# Patient Record
Sex: Female | Born: 1960 | Race: White | Hispanic: No | Marital: Married
Health system: Midwestern US, Academic
[De-identification: ages and names within clinical notes are randomized; demographics above are authoritative.]

---

## 2017-10-21 ENCOUNTER — Encounter: Admit: 2017-10-21 | Discharge: 2017-10-21 | Payer: BC Managed Care – PPO

## 2017-10-21 DIAGNOSIS — C20 Malignant neoplasm of rectum: Principal | ICD-10-CM

## 2017-10-21 NOTE — Telephone Encounter
Records reviewed orders placed for rectal Endoscopic Ultrasound at [facility]

## 2017-11-02 ENCOUNTER — Encounter: Admit: 2017-11-02 | Discharge: 2017-11-02 | Payer: BC Managed Care – PPO

## 2017-11-02 NOTE — Telephone Encounter
Contacted pt to schedule rectal EUS no answer. Left msg for pt to contact [phone].

## 2017-11-09 MED ORDER — PEG-ELECTROLYTE SOLN 420 GRAM PO SOLR
0 refills | Status: AC
Start: 2017-11-09 — End: ?

## 2017-11-09 NOTE — Telephone Encounter
Outbound call returning pt call from 11/02/17.     Pt is scheduled for GI procedure rectal EUS with Dr. Arnetha Gula on 11/16/17 1600. Pt denies being diabetic or on blood thinning product. Pt is aware a split dose bowel will need to be completed prior to procedure. Prep sent to preferred pharmacy.     Prep instructions emailed to pt at [email or web address]

## 2017-11-16 ENCOUNTER — Ambulatory Visit: Admit: 2017-11-16 | Discharge: 2017-11-16 | Payer: BC Managed Care – PPO

## 2017-11-23 ENCOUNTER — Encounter: Admit: 2017-11-23 | Discharge: 2017-11-23 | Payer: BC Managed Care – PPO

## 2018-03-07 ENCOUNTER — Encounter: Admit: 2018-03-07 | Discharge: 2018-03-07 | Payer: BC Managed Care – PPO

## 2018-03-07 DIAGNOSIS — Z08 Encounter for follow-up examination after completed treatment for malignant neoplasm: ICD-10-CM

## 2018-03-07 DIAGNOSIS — C20 Malignant neoplasm of rectum: Principal | ICD-10-CM

## 2018-03-07 MED ORDER — SODIUM CHLORIDE 0.9 % IV SOLP
INTRAVENOUS | 0 refills | Status: CN
Start: 2018-03-07 — End: ?

## 2018-03-08 ENCOUNTER — Ambulatory Visit: Admit: 2018-03-08 | Discharge: 2018-03-08 | Payer: BC Managed Care – PPO

## 2018-03-08 ENCOUNTER — Encounter: Admit: 2018-03-08 | Discharge: 2018-03-08 | Payer: BC Managed Care – PPO

## 2018-03-17 ENCOUNTER — Encounter: Admit: 2018-03-17 | Discharge: 2018-03-17 | Payer: BC Managed Care – PPO

## 2018-03-17 MED ORDER — PEG-ELECTROLYTE SOLN 420 GRAM PO SOLR
0 refills | Status: AC
Start: 2018-03-17 — End: ?

## 2018-05-27 ENCOUNTER — Encounter: Admit: 2018-05-27 | Discharge: 2018-05-27 | Payer: BC Managed Care – PPO

## 2018-05-27 DIAGNOSIS — F419 Anxiety disorder, unspecified: Principal | ICD-10-CM

## 2018-05-31 ENCOUNTER — Encounter: Admit: 2018-05-31 | Discharge: 2018-05-31 | Payer: BC Managed Care – PPO

## 2018-05-31 ENCOUNTER — Ambulatory Visit: Admit: 2018-05-31 | Discharge: 2018-05-31 | Payer: BC Managed Care – PPO

## 2018-05-31 ENCOUNTER — Ambulatory Visit: Admit: 2018-05-31 | Discharge: 2018-06-01 | Payer: BC Managed Care – PPO

## 2018-05-31 DIAGNOSIS — F419 Anxiety disorder, unspecified: Principal | ICD-10-CM

## 2018-05-31 MED ORDER — PROPOFOL 10 MG/ML IV EMUL 50 ML (INFUSION)(AM)(OR)
INTRAVENOUS | 0 refills | Status: DC
Start: 2018-05-31 — End: 2018-05-31

## 2018-05-31 MED ORDER — PROPOFOL INJ 10 MG/ML IV VIAL
0 refills | Status: DC
Start: 2018-05-31 — End: 2018-05-31

## 2018-05-31 MED ORDER — LACTATED RINGERS IV SOLP
INTRAVENOUS | 0 refills | Status: AC
Start: 2018-05-31 — End: ?

## 2018-05-31 MED ORDER — ONDANSETRON HCL (PF) 4 MG/2 ML IJ SOLN
4 mg | Freq: Once | INTRAVENOUS | 0 refills | Status: AC | PRN
Start: 2018-05-31 — End: ?

## 2018-05-31 MED ORDER — LIDOCAINE (PF) 10 MG/ML (1 %) IJ SOLN
.1-2 mL | INTRAMUSCULAR | 0 refills | Status: AC | PRN
Start: 2018-05-31 — End: ?

## 2018-05-31 MED ORDER — LACTATED RINGERS IV SOLP
0 refills | Status: DC
Start: 2018-05-31 — End: 2018-05-31

## 2018-06-01 ENCOUNTER — Encounter: Admit: 2018-06-01 | Discharge: 2018-06-01 | Payer: BC Managed Care – PPO

## 2018-06-01 DIAGNOSIS — F419 Anxiety disorder, unspecified: Principal | ICD-10-CM

## 2022-03-26 ENCOUNTER — Encounter: Admit: 2022-03-26 | Discharge: 2022-03-26 | Payer: BC Managed Care – PPO

## 2022-04-08 ENCOUNTER — Encounter: Admit: 2022-04-08 | Discharge: 2022-04-08 | Payer: Private Health Insurance - Indemnity

## 2022-04-08 ENCOUNTER — Ambulatory Visit: Admit: 2022-04-08 | Discharge: 2022-04-08 | Payer: Private Health Insurance - Indemnity

## 2022-04-08 DIAGNOSIS — R053 Chronic cough: Secondary | ICD-10-CM

## 2022-04-08 DIAGNOSIS — R059 Cough, unspecified type: Secondary | ICD-10-CM

## 2022-04-08 DIAGNOSIS — J329 Chronic sinusitis, unspecified: Secondary | ICD-10-CM

## 2022-04-08 LAB — CBC AND DIFF
ABSOLUTE BASO COUNT: 0 K/UL (ref 0–0.20)
ABSOLUTE EOS COUNT: 0.8 K/UL — ABNORMAL HIGH (ref 0–0.45)
ABSOLUTE LYMPH COUNT: 2 K/UL (ref 1.0–4.8)
ABSOLUTE MONO COUNT: 0.6 K/UL (ref 0–0.80)
ABSOLUTE NEUTROPHIL: 2.3 K/UL (ref 1.8–7.0)
BASOPHILS %: 1 % (ref 0–2)
EOSINOPHILS %: 14 % — ABNORMAL HIGH (ref >60)
HEMOGLOBIN: 13 g/dL (ref 12.0–15.0)
LYMPHOCYTES %: 35 % (ref 24–44)
MCH: 30 pg (ref 26–34)
MCHC: 33 g/dL (ref 32.0–36.0)
MCV: 92 FL (ref 80–100)
MPV: 7.2 FL (ref 7–11)
PLATELET COUNT: 306 K/UL — ABNORMAL HIGH (ref 150–400)
RDW: 14 % (ref 11–15)
WBC COUNT: 6 K/UL (ref 4.5–11.0)

## 2022-04-08 LAB — COMPREHENSIVE METABOLIC PANEL
ANION GAP: 11 % (ref 3–12)
CO2: 25 MMOL/L — ABNORMAL LOW (ref 21–30)
CREATININE: 0.7 mg/dL (ref 0.4–1.00)
GLUCOSE,PANEL: 83 mg/dL (ref 70–100)
POTASSIUM: 3.8 MMOL/L (ref 3.5–5.1)

## 2022-04-08 LAB — IMMUNOGLOBULINS-IGA,IGG,IGM
IGA: 240 mg/dL (ref 70–390)
IGG: 656 mg/dL — ABNORMAL LOW (ref 762–1488)
IGM: 168 mg/dL (ref 38–328)

## 2022-04-08 MED ORDER — FLUTICASONE PROPIONATE 50 MCG/ACTUATION NA SPSN
2 | Freq: Two times a day (BID) | NASAL | 1 refills | 60.00000 days | Status: AC
Start: 2022-04-08 — End: ?

## 2022-04-08 NOTE — Progress Notes
Date of Service: 04/08/2022       SUBJECTIVE  Heather Gilbert is a 61 y.o. 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  Referred by: Wilford Grist, MD.  PCP is: Hedda Slade, MD.      Chief Complaint   Patient presents with   ? New Patient       History of Present Illness    Per review of outside records, the patient has a longstanding h/o sinusitis and bronchitis, with a recent absolute eosinophil count of 1260.  Mentioned is a suspicion for an allergic origin of these conditions.  The patient has had multiple antibiotic and steroid courses over the last 5 months.  Most recently, in April 2023 was prescribed a 3-week course of Augmentin, as well as a course of prednisone, for chronic sinusitis.  CT sinus is planned for 4-6 weeks after completion of antibiotics.  CT chest was recently ordered but we do not have those results at the time of today's visit.  Of note, the patient's home was recently treated for presence of mold.     The patient states that she had pneumonia in Nov 2022, for which she was hospitalized and treated with IV antibiotics.  She was feeling better by the time she left the hospital, but not back to her normal self.  She never lost the cough after being discharged and her cough continued to be productive of thick, green mucus, and sometimes formed and bloody pieces of mucus.  She states that the recent CT showed mucus plugging and healed rib fractures, presumably from the violent coughing at the time of pneumonia.     Rhinitis/Sinusitis History  - Age at onset: Since childhood  - Symptoms:   - Worst symptom:   - Seasonality: Spring and fall are particularly bad, but some symptoms in winters  - Triggers: No  - H/o nasal polyps: No  - Aspirin/NSAID sensitivity: No  - Prior evaluation: No  - Prior allergy shots: No  - Pets: Dog, sleeps in her bed  - Previously tried meds:   - Zyrtec daily for 1 week around Jan or Feb 2023 with no immediate improvement    Respiratory History  - Never diagnosed with asthma and has no h/o inhaler prescription aside from albuterol inhaler prescribed at discharge from hospitalization for pneumonia.   - Symptoms: Coughing, headaches, fever, and sore throat occasionally proceed symptoms dropping into her lungs and coughing worsening.   - Triggers: Follows same pattern as sinus symptoms, 2-3 times per year with worst times being fall through spring.   - Worse with aspirin/NSAIDs: No  - Ever needed oral steroids for treatment of respiratory symptoms: 2-3 times per year.  - Antibiotics for symptoms: 2-3 times per year .  - Albuterol inhaler uses per week: No  - Ever had an asthma exacerbation requiring intubation: No  - Ever been hospitalized for asthma: No  - Waking at night due to symptoms such as wheezing, SOB, coughing, etc: Nightly over the last month.    Infection History  - Birth History:  Born full term.  No NICU stay after delivery.  No history of failure to thrive.  - In an average year:  3 sinus infections, 0 ear infections as an adult, 0 Strep pharyngitis as an adult, and 0 bacterial conjunctivitis.  Has never had pneumonia.    - No history  of severe infections with warts, recurrent or severe oropharyngeal or skin fungal infections, or recurrent skin or deep organ abscesses requiring I&D to resolve or cellulitis.  No history of any other unusual, severe, or recurrent infections.   - No history of infections needing hospitalization (infectious meningitis, osteomyelitis, sepsis, septic arthritis, or episodes of pneumonia) or need for IV antibiotics to treat infections.    - No history of IVIG therapy.  - No known history of using chemotherapy drugs, immunosuppressive medications, or anti-seizure medications.   - No known history of chronic liver disease, kidney disease, chronic diarrhea, HIV, autoimmune disease, or cancer.  - Up to date on all childhood vaccines and tolerated them to the best of their knowledge.   - No family history of primary immune deficiency.                   Review of Systems  A 12-point review of systems has been reviewed and the remainder are all negative other than as noted above and as in the HPI.  Any chronic issues are being addressed by a variety of physicians other than as is noted in the HPI.    04/08/2022 4:46 PM        Histories  Medical History:   Diagnosis Date   ? Anxiety      Surgical History:   Procedure Laterality Date   ? HX BUNIONECTOMY Right 2015   ? SIGMOIDOSCOPY WITH ENDOSCOPIC ULTRASOUND EXAMINATION - FLEXIBLE N/A 05/31/2018    Performed by Bernita Buffy, MD at [facility] OR   ? SIGMOIDOSCOPY WITH TRANSENDOSCOPIC ULTRASOUND GUIDED INTRAMURAL/ TRANSMURAL FINE NEEDLE ASPIRATION/ BIOPSY - FLEXIBLE N/A 05/31/2018    Performed by Bernita Buffy, MD at [facility] OR   ? DILATION AND CURETTAGE       No family history on file.  Social History     Socioeconomic History   ? Marital status: Married   Tobacco Use   ? Smoking status: Former     Packs/day: 0.50     Years: 3.00     Pack years: 1.50     Types: Cigarettes   ? Smokeless tobacco: Never   Substance and Sexual Activity   ? Alcohol use: Yes     Alcohol/week: 6.0 standard drinks     Types: 6 Cans of beer per week     Comment: OCCASIONALLY   ? Drug use: Not Currently     No Known Allergies                  OBJECTIVE           Medications  ? amoxicillin/K clavulanate (AUGMENTIN) 500/125 mg tablet Take one tablet by mouth every 8 hours. Take with food.   ? citalopram hydrobromide (CELEXA PO) Take  by mouth.   ? fluticasone propionate (FLONASE ALLERGY RELIEF) 50 mcg/actuation nasal spray, suspension Apply two sprays to each nostril as directed twice daily. Shake bottle gently before using.       Vitals:    04/08/22 1524   BP: 133/88   Pulse: 97   SpO2: 96%   PainSc: Zero   Weight: Comment: Per pt       Physical Exam  General: Alert, cooperative, and in no acute distress.  Eye: PER, EOMI.  No scleral icterus.  Clear conjunctiva.  HENT: Normocephalic.  Normal external ear structures.  TMs intact bilaterally without erythema, edema, or fluid.  No sinus tenderness.  No rhinorrhea present.  Moist mucous membranes.  No oral lesions or erythema.   CV: Regular rate and rhythm.  No murmur, gallop, or rub.    Pulm: Good air movement all the way to lung bases, wheezes at lung bases (R>L) at end-inspiration.     Skin: Warm and dry.  Normal color and texture.  No rashes or lesions on exposed skin.    MSK: No joint abnormalities.  Grossly normal ROM.   No lower extremity edema.    Neuro: Motor and sensation grossly intact.  CNs grossly intact.          ASSESSMENT & PLAN  Problem   Recurrent Sinusitis   Chronic Cough    Recurrent sinusitis has been a problem for many years, as well as recurrent bronchitis.  Since Nov 2022 when she was hospitalized with pneumonia, chronic cough has been an issue.  Symptoms have not resolved despite multiple rounds of antibiotics and steroids since then.      Reported eosinophilia in the presence of chronic cough and sinusitis hints at the potential for an allergic process that could be at least contributing to her symptoms.  Vasculitis is also considered given sinusitis and persistent cough that has been productive of blood-streaked mucus.  ABPA is another possibility, again given the persistence of symptoms and the patient's report of formed pieces of dark colored mucus and CT chest showing mucus plugging.  While her h/o is not strongly suggestive of an underlying immunodeficiency, an initial evaluation for such is appropriate given her report of sinusitis/bronchitis requiring treatment with antibiotics and steroids multiple times a year, even before her more recent hospitalization for pneumonia.  Recurrent bronchitis is a common diagnosis in patient's with asthma and thus further evaluation for the possibility of asthma is warranted. Plan:  - CT max/face  - Complete PFTs with methacholine challenge  - Labs:   - IgG, IgM, IgA   - CBC w/ diff   - CMP   - MPO/PR3 w/ reflex to ANCA   - Aspergillosis panel  - Referral to Pulmonology  - Start Flonase nasal spray, 2 sprays each nostril in the morning and evening.    - If no improvement with Flonase after 3-4 weeks, continue Flonase and add on azelastine nasal spray, 2 sprays each nostril in the morning and evening.   - RTC for aeroallergen SPT  - If cough is ever productive of frank blood (ie, more than just minor blood-streaking of mucus), go to the nearest ER or call 911 immediately.                     RTC 4 weeks.    Thank you for allowing [place] to participate in this patient's care.  Patient was seen and discussed with Dr. Mikey College.  Please feel free to contact us with any questions or concerns.    Cindra Presume, MD  Allergy/Immunology Fellow  Division of Allergy, Immunology, and Rheumatology  Department of Internal Medicine and Department of Pediatrics  [facility]           [place] Primary Allergist: Dr. Kirtland Bouchard at [facility].

## 2022-04-21 ENCOUNTER — Encounter: Admit: 2022-04-21 | Discharge: 2022-04-21 | Payer: Private Health Insurance - Indemnity

## 2022-04-21 ENCOUNTER — Ambulatory Visit: Admit: 2022-04-21 | Discharge: 2022-04-21 | Payer: Private Health Insurance - Indemnity

## 2022-04-21 DIAGNOSIS — R059 Cough, unspecified type: Secondary | ICD-10-CM

## 2022-04-28 ENCOUNTER — Encounter: Admit: 2022-04-28 | Discharge: 2022-04-28 | Payer: Private Health Insurance - Indemnity

## 2022-04-28 NOTE — Telephone Encounter
Received vm from pt. Pt scheduled for PFTs on 5/23. Has follow up with Dr. Floydene Flock 5/24. Pt would like to know if PFT can be completed while on steroids. Pt has steroid shot on 5/15 with a 5 day course of oral steroids. Routing to provider

## 2022-04-30 ENCOUNTER — Encounter: Admit: 2022-04-30 | Discharge: 2022-04-30 | Payer: Private Health Insurance - Indemnity

## 2022-04-30 NOTE — Telephone Encounter
Received VM from pt. Pt would like to know if PFT can be completed while on steroids. Pt had steroid shot on 5/15 with a 5 day course of oral steroids. Routed to provider on 5/16.Will route to on call provider

## 2022-05-05 ENCOUNTER — Encounter: Admit: 2022-05-05 | Discharge: 2022-05-05 | Payer: Private Health Insurance - Indemnity

## 2022-05-05 ENCOUNTER — Ambulatory Visit: Admit: 2022-05-05 | Discharge: 2022-05-05 | Payer: Private Health Insurance - Indemnity

## 2022-05-05 DIAGNOSIS — R059 Cough, unspecified type: Secondary | ICD-10-CM

## 2022-05-06 ENCOUNTER — Encounter: Admit: 2022-05-06 | Discharge: 2022-05-06 | Payer: Private Health Insurance - Indemnity

## 2022-05-06 ENCOUNTER — Ambulatory Visit: Admit: 2022-05-06 | Discharge: 2022-05-07 | Payer: Private Health Insurance - Indemnity

## 2022-05-06 DIAGNOSIS — J329 Chronic sinusitis, unspecified: Secondary | ICD-10-CM

## 2022-05-06 DIAGNOSIS — F419 Anxiety disorder, unspecified: Secondary | ICD-10-CM

## 2022-05-06 DIAGNOSIS — R053 Chronic cough: Secondary | ICD-10-CM

## 2022-05-06 MED ORDER — FLUTICASONE PROPIONATE 220 MCG/ACTUATION IN HFAA
2 | Freq: Every day | RESPIRATORY_TRACT | 3 refills | Status: AC
Start: 2022-05-06 — End: ?

## 2022-05-06 MED ORDER — IPRATROPIUM BROMIDE 42 MCG (0.06 %) NA SPRY
2 | Freq: Four times a day (QID) | NASAL | 3 refills | 30.00000 days | Status: AC
Start: 2022-05-06 — End: ?

## 2022-05-06 NOTE — Progress Notes
Date of Service: 05/06/2022  Date of last visit with Allergy/Immunology: 04/08/2022 with Dr. Kirtland Bouchard       SUBJECTIVE  Heather Gilbert is a 61 y.o. female with h/o eosinophilia; chronic sinusitis; chronic bronchitis who presents today to the Allergy/Immunology clinic for a follow-up visit.      Chief Complaint   Patient presents with   ? Follow Up     Cough, unspecified type       History of Present Illness     Following her last visit, we had her obtain multiple labs in evaluation of her signs and symptoms.  Notable was a slightly elevated eosinophil count of 840 (down from 1260), a significantly elevated IgE count of over 2500, and a slightly low IgG count of 656.  In addition, she underwent complete PFTs with methacholine challenge, which showed decreased FEV1/FVC ratio of 66%, FEF 25-75% reduced value of 43%, and 21% decrease in FEV1 upon exposure to 0.5 mg/ml methacholine.      Today the patient reports that her symptoms have improved, overall, but she still experiences cough, as well as nasal/sinus congestion and drainage.  She denies shortness of breath and wheezing at this time.  She saw her PCP about 2 weeks ago and was prescribed doxycycline and another course of prednisone; she completed the prednisone on 05/02/22 and has a few days left of the doxycycline.  She feels these medications have been beneficial.      At her previous visit, she reported episodes of wheezing, nasal/sinus congestion, runny nose, and postnasal drainage since childhood, though she has never been diagnosed with asthma or allergic rhinitis.  Spring and fall are particularly bad times of year for these symptoms.      She denies h/o rashes, neuropathy, and arthralgia/myalgia. Her sense of smell is intact.       Review of Systems  A 12-point review of systems has been reviewed and the remainder are all negative other than as noted above and as in the HPI.  Any chronic issues are being addressed by a variety of physicians other than as is noted in the HPI.         OBJECTIVE      05/06/2022 3:51 PM      Medications  ? citalopram hydrobromide (CELEXA PO) Take  by mouth.   ? doxycycline hyclate (VIBRACIN) 100 mg tablet    ? fluticasone propionate (FLONASE ALLERGY RELIEF) 50 mcg/actuation nasal spray, suspension Apply two sprays to each nostril as directed twice daily. Shake bottle gently before using.   ? fluticasone propionate (FLOVENT HFA) 220 mcg/actuation inhaler Inhale two puffs by mouth into the lungs daily.   ? ipratropium bromide (ATROVENT) 42 mcg (0.06 %) nasal spray Apply two sprays to each nostril as directed four times daily.       Vitals:    05/06/22 1337   BP: 125/85   BP Source: Arm, Left Upper   Pulse: 85   Temp: 36.2 ?C (97.2 ?F)   Resp: 20   TempSrc: Oral   PainSc: Zero   Weight: 65.8 kg (145 lb)  Comment: Per pt   Height: 158.8 cm (5' 2.5)       Physical Exam  General: Alert, cooperative, and in no acute distress.  Eye: PER, EOMI.  No scleral icterus.  Clear conjunctiva.  HENT: Normocephalic.  Normal external ear structures.  No sinus tenderness.  No rhinorrhea present.  Moist mucous membranes. No nasal polyps seen.   CV: Regular rate and  rhythm.  No murmur, gallop, or rub.    Pulm: Clear to auscultation bilaterally.  No wheezes or crackles.     Skin: Warm and dry.  Normal color and texture.  No rashes or lesions on exposed skin.    MSK: No joint abnormalities.  Grossly normal ROM.  No lower extremity edema.    Neuro: Motor and sensation grossly intact.  CNs grossly intact.     Testing Information  Consent Obtained: Yes  Time Antigen Placed: 1424  Needle Type: Multi-Test II Device  Test Location: Back  Antigen Manufacturer: [employer]  Testing Nurse: L.Freddy Jaksch  Reviewing Physician: Reola Calkins MD  Time Test Read: [phone]    Controls  1. Dil w/50% Glycerin (HS): 0/0  2. Histamine dihydrochloride: 4/4    Trees  Ash, White 1:20 (HS): 0/0  Birch Mix 1:20 (HS): 0/0  Box Elder 1:20 (HS): 0/0  Cedar, Mountain 1:20 (HS): 0/0  Cottonwood 1:20 (HS): 5/5  Elm, Chinese 1:20 (HS): 0/0  Maple Mix 1:20 (HS): 0/0  Mulberry Mix 1:20 (HS): 0/0  Oak Mix 1:20 (HS): 0/0  Pine Mix 1:20 (HS): 0/0  Sweetgum 1:20 (HS): 0/0  Sycamore, American 1:20 (HS): 0/0  Walnut, Black 1:20 (HS): 0/0    Grasses  [place] 10,000 BAU/ml (HS): 0/0  Johnson 1:20 (HS): 0/0  Sweet Vernal 100,000 BAU/ml (HS): 0/0  Timothy 100,000 BAU/ml (HS): 0/0     Weeds  Careless/Pigweed Mix 1:20 (HS): 0/0  Cocklebur 1:20 (HS): 0/0  Dock/Sorrel Mix 1:20 (HS): 0/0  Kochia 1:20 (HS): 0/0  Marsh Elder 1:20 (HS): 0/0  Nettle 1:20 (HS): 0/0  Plantain, English 1:20 (HS): 0/0  Ragweed, Short 1:20 (HS): 0/0  Ragweed, Western 1:20 (HS): 0/0  [place] Thistle 1:20 (HS): 0/0  Sagebrush/Mugwort 1:20 (HS): 0/0    Mites and Cockroaches  Cockroach, American 1:10 (HS): 0/0  Cockroach, German 1:10 (HS): 0/0  Derm. Farinae 30,000 AU/ml (HS): 0/0  Derm. Pteronyssinus 30,000 AU/ml (HS): 0/0    Animals  Cat, AP Pelt 10,000 BAU/ml (HS): 0/0  Dog, AP Hair and Dander 1:10 (HS): 0/0  Feather Mix 1:10 (HS): 0/0    Molds and Fungi  Alternaria Tenuis 1:10 (HS): 0/0  Aspergillus Fumigatus 1:10 (HS): 0/0  Curvularia Spiciferas 1:10 (HS): 0/0  Epicoccum Nigrum 1:10 (HS): 0/0  Fusarium Vasinfectum 1:10 (HS): 0/0  Helminthosporium Inters. 1:10 (HS): 0/0  Horm. Cladosporioides 1:10 (HS): 0/0  Mucor Racemosus 1:10 (HS): 0/0  Penicillium [place] 1:10 (HS): 0/0  Phoma Herbarum 1:10 (HS): 0/0  Rhizopus Nigricans 1:10 (HS): 0/0  Stemphylium Botryosum 1:10 (HS): 0/0  Histamine Phosphate 10mg /ml (HS): 8/8           ASSESSMENT & PLAN    Problem   Chronic Cough    Recurrent sinusitis has been a problem for many years, as well as recurrent bronchitis.  Since Nov 2022 when she was hospitalized with pneumonia, chronic cough has been an issue.  Symptoms have not resolved despite multiple rounds of antibiotics and steroids since then.    Reported eosinophilia in the presence of chronic cough and sinusitis hints at the potential for an allergic process that could be at least contributing to her symptoms.  Vasculitis is also considered given sinusitis and persistent cough that has been productive of blood-streaked mucus in the past.  ABPA was considered, again given the persistence of symptoms and the patient's report of formed pieces of dark colored mucus and CT chest showing mucus plugging, but testing was negative.  04/08/22 - Labwork notable for a slightly elevated eosinophil count of 840 (down from 1260), a significantly elevated IgE count of over 2500, and a slightly low IgG count of 656.     05/05/22 - PFT with decreased FEV1/FVC of 66%, FEF 25-75% reduced value of 43%, and 21% decrease in FEV1 upon exposure to 0.5 mg/ml methacholine.      05/06/22 - SPT positive only for sensitization to [facility], which would not explain persistent symptoms since last fall.     Plan:  - Patient will request CT chest from OSH be sent to our clinic.  - Continue Flonase 2 sprays each nostril in the morning and evening.   - Start Atrovent 2 sprays 2-4 times daily as needed for PND.  - Start Flovent 220 mcg 2 puffs daily.  - Repeat CBC w/ diff, IgE, and IgG in 1 month (last dose of steroids was on 05/02/22).  - If concerning findings on CT chest from OSH and/or on repeat blood work, consider referral to ENT to evaluate for presence of nasal polyps and Rheumatology to evaluate for EGPA.    - Continue to monitor for the following signs/symptoms that would be concerning for EGPA:   - Arthralgia   - Myalgia   - Neuropathy (particularly in a stocking/glove distribution)   - Rash (especially on lower extremities)   - Cardiac symptoms   - Worsening respiratory function  - If cough is ever productive of frank blood (ie, more than just minor blood-streaking of mucus), go to the nearest ER or call 911 immediately.                   RTC in 6 weeks.    Thank you for allowing [place] to participate in this patient's care.  Patient was seen and discussed with Dr. Barbette Or. Please feel free to contact us with any questions or concerns.    Cindra Presume, MD  Allergy/Immunology Fellow  Division of Allergy, Immunology, and Rheumatology  Department of Internal Medicine and Department of Pediatrics  [facility]         [place] Primary Allergist: Dr. Kirtland Bouchard at [facility]

## 2022-05-07 ENCOUNTER — Encounter: Admit: 2022-05-07 | Discharge: 2022-05-07 | Payer: Private Health Insurance - Indemnity

## 2022-05-07 DIAGNOSIS — F419 Anxiety disorder, unspecified: Secondary | ICD-10-CM

## 2022-05-25 ENCOUNTER — Encounter: Admit: 2022-05-25 | Discharge: 2022-05-25 | Payer: Private Health Insurance - Indemnity

## 2022-05-25 NOTE — Telephone Encounter
Received fax from [place] stating that Fluticasone HFA is a covered medication on patient's plan and PA is not required.

## 2022-06-26 ENCOUNTER — Encounter: Admit: 2022-06-26 | Discharge: 2022-06-26 | Payer: Private Health Insurance - Indemnity

## 2022-06-29 ENCOUNTER — Encounter: Admit: 2022-06-29 | Discharge: 2022-06-29 | Payer: Private Health Insurance - Indemnity

## 2022-06-29 ENCOUNTER — Ambulatory Visit: Admit: 2022-06-29 | Discharge: 2022-06-29 | Payer: Private Health Insurance - Indemnity

## 2022-06-29 DIAGNOSIS — J45909 Unspecified asthma, uncomplicated: Secondary | ICD-10-CM

## 2022-06-29 DIAGNOSIS — R0602 Shortness of breath: Secondary | ICD-10-CM

## 2022-06-29 DIAGNOSIS — J189 Pneumonia, unspecified organism: Secondary | ICD-10-CM

## 2022-06-29 DIAGNOSIS — F419 Anxiety disorder, unspecified: Secondary | ICD-10-CM

## 2022-06-29 DIAGNOSIS — J329 Chronic sinusitis, unspecified: Secondary | ICD-10-CM

## 2022-06-29 DIAGNOSIS — J42 Unspecified chronic bronchitis: Secondary | ICD-10-CM

## 2022-06-29 DIAGNOSIS — R053 Chronic cough: Secondary | ICD-10-CM

## 2022-06-29 DIAGNOSIS — J4540 Moderate persistent asthma, uncomplicated: Secondary | ICD-10-CM

## 2022-06-29 DIAGNOSIS — C801 Malignant (primary) neoplasm, unspecified: Secondary | ICD-10-CM

## 2022-06-29 LAB — CBC AND DIFF
ABSOLUTE BASO COUNT: 0 K/UL (ref 0–0.20)
ABSOLUTE EOS COUNT: 0.5 K/UL — ABNORMAL HIGH (ref 0–0.45)
ABSOLUTE LYMPH COUNT: 1.6 K/UL (ref 1.0–4.8)
ABSOLUTE MONO COUNT: 0.5 K/UL (ref 0–0.80)
ABSOLUTE NEUTROPHIL: 2.5 K/UL (ref 1.8–7.0)
HEMOGLOBIN: 13 g/dL (ref 12.0–15.0)
RBC COUNT: 4.2 M/UL — ABNORMAL HIGH (ref 4.0–5.0)
WBC COUNT: 5.3 K/UL (ref 4.5–11.0)

## 2022-06-29 LAB — IMMUNOGLOBULIN E (IGE): IGE: >25 [IU]/mL — ABNORMAL HIGH (ref <101)

## 2022-06-29 LAB — IMMUNOGLOBULIN G (IGG): IGG: 677 mg/dL — ABNORMAL LOW (ref 762–1488)

## 2022-06-29 NOTE — Progress Notes
Date of Service: 06/29/2022    Subjective:             Heather Gilbert is a very pleasant 61 y.o. female presenting for further management of her pulmonary issues.    Heather Gilbert was accompanied today by her mother who also helps provide history.  Patient reports being at baseline health until September when she traveled to [place] for 1 week. Since September patient has gone through 3-4 cycles of treatment with steroids and antibiotics where she sees improvement until steroids and antibiotics are terminated and then symptoms recur.  Most bothersome symptoms include postnasal drip, cough with green sputum production, chest tightness, intermittent wheezing, and DOE with rest.  In November patient reports being hospitalized with pneumonia, suspected bacterial at outside hospital.  During that hospital stay she was treated with antibiotics, steroids and discharged with nebulizer and short course albuterol.  Patient noted some improvement but never returned to baseline function prior to September.    More recently in May patient was seen in allergy clinic and started on Flovent for suspected asthma.  Patient underwent PFTs which noted mild obstruction and 20% decrease in FVC with methacholine consistent with asthma.  She was started on Flovent 2 puffs/day.  Noted significant improvement for the next 4 to 5 weeks, but the previous 2 weeks is now worsening symptoms.  Specifically she has noted worsening postnasal drip, sinusitis, sinus pressure.  Is currently on Flonase and intranasal ipratropium which helps somewhat but symptoms still managed to recur.     Patient did have pneumonia at the age of 6 that required hospitalization and IV antibiotics but otherwise has been free of asthma.  Has lungs that report lung diseases including asthma and COPD.        Cough questions  - Duration: months  - Timing: worse in the am  - Sputum production: green  - Aggravating factors:  infection or hot air, but often random.   - Alleviating factors: Rest and fluids help minimally  - Associated with eating:  No  - Associated with drinking:  No  - Any allergies: No  - Any post-nasal drip: Yes  - Any shortness of breath: Yes  - Any wheezing: Yes  - Previous tried therapies: antitussives, hydration or inhalers: Previous nebulizer    Asthma questions  - Diagnosed: month  - Shortness of breath: Yes  - Shortness of breath severity: at rest  - Aggravating factors:  exertion, exercise, climbing stairs  - Alleviating factors: only temporary improvement  - Any cough: Yes  - Sputum production: Yes green  - Any wheezing: Yes  - Nocturnal symptoms: Yes Wakes up two times per week   - Previous tried therapies: antibiotics  - SABA use: Previous albuterol but not recently. Helped when did use  - Corticosteroids in the last 12 months: Yes  - ER/urgent care visits or hospitalizations in the last 12 months: Yes 1         Review of Systems   Constitutional: Negative for chills, diaphoresis, fever and unexpected weight change.   Respiratory: Positive for cough and shortness of breath.    Cardiovascular: Negative for chest pain and palpitations.   Gastrointestinal: Negative for abdominal pain, constipation, diarrhea, nausea and vomiting.        Negative for Dysphagia         Objective:         ? citalopram hydrobromide (CELEXA PO) Take  by mouth.   ? doxycycline hyclate (VIBRACIN) 100 mg  tablet    ? fluticasone propionate (FLONASE ALLERGY RELIEF) 50 mcg/actuation nasal spray, suspension Apply two sprays to each nostril as directed twice daily. Shake bottle gently before using.   ? fluticasone propionate (FLOVENT HFA) 220 mcg/actuation inhaler Inhale two puffs by mouth into the lungs daily.   ? ipratropium bromide (ATROVENT) 42 mcg (0.06 %) nasal spray Apply two sprays to each nostril as directed four times daily.     There were no vitals filed for this visit.  There is no height or weight on file to calculate BMI.     Physical Exam  Constitutional:       General: She is not in acute distress.     Appearance: She is not ill-appearing or toxic-appearing.   HENT:      Head: Normocephalic and atraumatic.      Nose: Nose normal.      Mouth/Throat:      Mouth: Mucous membranes are moist.      Pharynx: Oropharynx is clear.   Eyes:      Conjunctiva/sclera: Conjunctivae normal.   Cardiovascular:      Rate and Rhythm: Normal rate and regular rhythm.      Heart sounds: No murmur heard.     No friction rub. No gallop.   Pulmonary:      Effort: Pulmonary effort is normal. No respiratory distress.      Breath sounds: Normal breath sounds. No wheezing or rales.   Abdominal:      General: Abdomen is flat.      Palpations: Abdomen is soft.      Tenderness: There is no abdominal tenderness.   Neurological:      General: No focal deficit present.      Mental Status: She is alert and oriented to person, place, and time. Mental status is at baseline.              Latest Reference Range & Units 04/08/22 16:54   Hemoglobin 12.0 - 15.0 GM/DL 16.1   Hematocrit 36 - 45 % 41.5   Platelet Count 150 - 400 K/UL 306   White Blood Cells 4.5 - 11.0 K/UL 6.0   Neutrophils 41 - 77 % 39 (L)   Absolute Neutrophil Count 1.8 - 7.0 K/UL 2.33   Lymphocytes 24 - 44 % 35   Absolute Lymph Count 1.0 - 4.8 K/UL 2.07   Monocytes 4 - 12 % 11   Absolute Monocyte Count 0 - 0.80 K/UL 0.67   Eosinophils 0 - 5 % 14 (H)   Absolute Eosinophil Count 0 - 0.45 K/UL 0.84 (H)   Absolute Basophil Count 0 - 0.20 K/UL 0.08   Basophils 0 - 2 % 1   RBC 4.0 - 5.0 M/UL 4.49   MCV 80 - 100 FL 92.4   MCH 26 - 34 PG 30.7   MCHC 32.0 - 36.0 G/DL 09.6   MPV 7 - 11 FL 7.2   RDW 11 - 15 % 14.3   (L): Data is abnormally low  (H): Data is abnormally high       04/08/22 16:54   Total IgE 2,521 (H)   Aspergillus Fumigatus IgE <0.10   Reference range: <0.35   Unit: [place]/L      Aspergillus Fumigatus Class IgE 0   Aspergillus Fumigatus IgG 2.5   (H): Data is abnormally high    PFT personally reviewed: 05-05-2022, mild OVD, normal diffusing capacity    Methylene choline challenge test: 05-05-2022, greater than 20%  FEV1 decline with 0.5 mg/mL, consistent with positive response to methacholine    Radiology imaging personally reviewed:  CT chest, 03-30-2022:  1.  Abberant right subclavian artery  2.  Left lower lobe mucous plugging  3.  Tree-in-bud opacities  4.  Subsegmental atelectasis    Assessment & Plan   Heather Gilbert is a very pleasant 61 y.o. female presenting for further management of her pulmonary issues.    # Shortness of breath  # Chronic cough  # Asthma, not well controlled  # Abberant right subclavian artery  # Post nasal drip    Her cough and shortness of breath most likely related to an asthmatic process.  She also has a notable peripheral eosinophilia and elevated IgE.  Primary symptoms include postnasal drip and recurrent sinusitis that may be driving as asthma symptoms.  Today lungs with excellent airflow.  As patient has had significant benefit from Flovent we will currently double the dose.  Patient is currently scheduled to see allergy back in clinic 7/19. If her symptoms persistent may consider adding LABA/LAMA combination then biologic therapy.    Plan  > Increase Flovent 2 mg twice daily  > Pending recommendation from allergy, can consider evaluation of LAMA/LABA if patient continues to have recurrent symptoms.  > Appreciate evaluation for possible autoimmune/rheumatologic process.  > Ordered strongyloides antibody  > Return to clinic in 2-3 months.      Complexity: moderate  review/order lab tests, review/order radiology, review/order other diagnostic test, discussion of results with performing physician, decision to obtain old records and review/interpretation of images/stains/EKG/histopathology  new problem to examiner with additional work-up      Return visit:  in 3 month(s).    Gasper Lloyd, MD  Assistant Profressor of Medicine  Pulmonary and Critical Care Physician  06/29/2022

## 2022-06-29 NOTE — Telephone Encounter
Received lab results from Dr. Floydene Flock.. Printed and put in Megan's folder to be entered into 02.

## 2022-06-29 NOTE — Patient Instructions
Clinic Visit Summary:     Today we discussed your shortness of breath and wheezing in pulmonary clinic.  1) Will increase Flovent to twice a day  2) Added lab to blood work to investigate parasite    It was a pleasure to see you today, please don't hesitate to call with any future questions or concerns.      For questions or concerns, please contact my Clinical Nurse Coordinator at [phone] or send a My Chart message.     Please contact my nurse with any signs and symptoms of worsening productive cough with thick secretions, blood in sputum, chest pain or tightness, shortness of breath, fever, chills, night sweats, or any other pulmonary concerns.     -  For URGENT issues after business hours, weekends, or holidays: Call [phone] and request for the Pulmonary Fellow to be paged.    -  For medication refills, please ask your pharmacy to send an electronic request.  Please allow at least 3 business days for medication refills.      -  For equipment issues, please contact your Durable Medical Equipment (DME) company directly.    -  To cancel, change, or schedule a clinic appointment: Call Pulmonary Scheduling at [phone].

## 2022-07-01 ENCOUNTER — Encounter: Admit: 2022-07-01 | Discharge: 2022-07-01 | Payer: Private Health Insurance - Indemnity

## 2022-07-01 ENCOUNTER — Ambulatory Visit: Admit: 2022-07-01 | Discharge: 2022-07-02 | Payer: Private Health Insurance - Indemnity

## 2022-07-01 DIAGNOSIS — R053 Chronic cough: Secondary | ICD-10-CM

## 2022-07-01 DIAGNOSIS — J324 Chronic pansinusitis: Secondary | ICD-10-CM

## 2022-07-01 DIAGNOSIS — J329 Chronic sinusitis, unspecified: Secondary | ICD-10-CM

## 2022-07-01 DIAGNOSIS — D721 Eosinophilia, unspecified type: Secondary | ICD-10-CM

## 2022-07-01 DIAGNOSIS — D801 Nonfamilial hypogammaglobulinemia: Secondary | ICD-10-CM

## 2022-07-01 MED ORDER — PREDNISONE 10 MG PO TAB
10 mg | ORAL_TABLET | Freq: Two times a day (BID) | ORAL | 0 refills | Status: AC
Start: 2022-07-01 — End: ?

## 2022-07-01 MED ORDER — AMOXICILLIN-POT CLAVULANATE 875-125 MG PO TAB
1 | ORAL_TABLET | Freq: Two times a day (BID) | ORAL | 0 refills | 7.00000 days | Status: AC
Start: 2022-07-01 — End: ?

## 2022-08-27 ENCOUNTER — Encounter: Admit: 2022-08-27 | Discharge: 2022-08-27 | Payer: Private Health Insurance - Indemnity

## 2022-08-27 MED ORDER — FLUTICASONE PROPIONATE 50 MCG/ACTUATION NA SPSN
1 refills
Start: 2022-08-27 — End: ?

## 2022-08-28 ENCOUNTER — Encounter: Admit: 2022-08-28 | Discharge: 2022-08-28 | Payer: Private Health Insurance - Indemnity

## 2022-08-28 MED ORDER — IPRATROPIUM BROMIDE 42 MCG (0.06 %) NA SPRY
2 | Freq: Four times a day (QID) | NASAL | 3 refills
Start: 2022-08-28 — End: ?

## 2022-10-05 ENCOUNTER — Encounter: Admit: 2022-10-05 | Discharge: 2022-10-05 | Payer: Private Health Insurance - Indemnity

## 2022-10-14 ENCOUNTER — Encounter: Admit: 2022-10-14 | Discharge: 2022-10-14 | Payer: Private Health Insurance - Indemnity

## 2022-10-14 ENCOUNTER — Ambulatory Visit: Admit: 2022-10-14 | Discharge: 2022-10-14 | Payer: Private Health Insurance - Indemnity

## 2022-10-14 DIAGNOSIS — J42 Unspecified chronic bronchitis: Secondary | ICD-10-CM

## 2022-10-14 DIAGNOSIS — C801 Malignant (primary) neoplasm, unspecified: Secondary | ICD-10-CM

## 2022-10-14 DIAGNOSIS — J329 Chronic sinusitis, unspecified: Secondary | ICD-10-CM

## 2022-10-14 DIAGNOSIS — J45909 Unspecified asthma, uncomplicated: Secondary | ICD-10-CM

## 2022-10-14 DIAGNOSIS — D801 Nonfamilial hypogammaglobulinemia: Secondary | ICD-10-CM

## 2022-10-14 DIAGNOSIS — F419 Anxiety disorder, unspecified: Secondary | ICD-10-CM

## 2022-10-14 DIAGNOSIS — J189 Pneumonia, unspecified organism: Secondary | ICD-10-CM

## 2022-10-14 DIAGNOSIS — J4540 Moderate persistent asthma, uncomplicated: Secondary | ICD-10-CM

## 2022-10-14 MED ORDER — ALBUTEROL SULFATE 90 MCG/ACTUATION IN HFAA
2 | RESPIRATORY_TRACT | 0 refills | Status: AC | PRN
Start: 2022-10-14 — End: ?

## 2022-10-14 NOTE — Patient Instructions
You may call us with any questions or concerns.   Your Allergist's nurse can be reached at [phone] during usual business hours.   If you need to reach the Allergist/Pulmonology after usual business hours, you may call the hospital operator at [phone] and ask to speak with the Pulmonology fellow on-call.     Briselda Naval Rossitto's  Asthma Action Plan  10/14/2022  In General:   1) Eliminate exposure to your allergens and tobacco smoke   2) Rinse out your mouth after using any inhaled medication   3) Use your spacer device with all 'HFA' medications   4) Get your flu shot every year    5) Call and ask questions, especially if you change zones, below    Helpful phone numbers:   During usual office hours: Our nurse can be reached at [phone] during open business hours.   After usual business hours: Call the hospital operator at [phone] and ask to speak with the Allergy fellow on-call.           ** Chest symptoms include one or more of the following: cough, wheeze, chest tightness and shortness of breath**    GREEN ZONE:  When:   1) Generally feeling fine and   2) Needing albuterol no more than 2 days per week.  3) Chest symptoms lasting no more than 2 days a week.  If you have chest symptoms, take:   Albuterol 2 puffs every 4 hours, as needed   Albuterol 2 puffs 15-30 minutes prior to scheduled exercise, if needed  Regardless of chest symptoms, take these 'controller' medications:   Flovent 2 puffs twice a day    YELLOW ZONE:  When:   1) Chest symptoms lasting more than 2 days a week    2) Coughing or wheezing at night and waking up.  3) Cannot do usual activities because of asthma, or missing school or work because of asthma.   4) Start at the first sign of a cold virus, bronchitis, or flu symptoms or   5) Two days before entering an unfriendly environment or   6) If needing albuterol more than twice per week because of chest symptoms  How long:   At least 2 weeks and through 1 week of feeling well.  If you have chest symptoms, take:   Albuterol 2 puffs every 4 hours, as needed   Albuterol 2 puffs 15-30 minutes prior to scheduled exercise, if needed  Regardless of chest symptoms, take these 'controller' medications:   Flovent 2 puffs twice a day    RED ZONE:   Chest symptoms are not controlled by the Yellow Zone medications.  What to do:   Albuterol HFA 4-6 puffs inhaled every 30 minutes, as needed   Go directly to the nearest Emergency Room for treatment or call 911 for a life-threatening attack.

## 2022-10-19 ENCOUNTER — Encounter: Admit: 2022-10-19 | Discharge: 2022-10-19 | Payer: Private Health Insurance - Indemnity

## 2022-10-19 ENCOUNTER — Ambulatory Visit: Admit: 2022-10-19 | Discharge: 2022-10-20 | Payer: Private Health Insurance - Indemnity

## 2022-10-19 DIAGNOSIS — J45909 Unspecified asthma, uncomplicated: Secondary | ICD-10-CM

## 2022-10-19 DIAGNOSIS — C801 Malignant (primary) neoplasm, unspecified: Secondary | ICD-10-CM

## 2022-10-19 DIAGNOSIS — J189 Pneumonia, unspecified organism: Secondary | ICD-10-CM

## 2022-10-19 DIAGNOSIS — R053 Chronic cough: Secondary | ICD-10-CM

## 2022-10-19 DIAGNOSIS — J329 Chronic sinusitis, unspecified: Secondary | ICD-10-CM

## 2022-10-19 DIAGNOSIS — F419 Anxiety disorder, unspecified: Secondary | ICD-10-CM

## 2022-10-19 DIAGNOSIS — J42 Unspecified chronic bronchitis: Secondary | ICD-10-CM

## 2022-10-19 MED ORDER — FLUTICASONE PROPIONATE 220 MCG/ACTUATION IN HFAA
2 | Freq: Every day | RESPIRATORY_TRACT | 3 refills | Status: AC
Start: 2022-10-19 — End: ?

## 2022-10-19 NOTE — Progress Notes
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    History of Present Illness  We first met on 06-29-2022, where she described a several month history of cough, shortness of breath, sputum production, chest tightening, wheezing, and postnasal drip.  Her symptoms started after a 4-day trip to [place] in September 2022.  She has received multiple cycles of steroids and antibiotics with temporary improvement and her symptoms.    As part of her work-up she has undergone a CBC with differential,  CMP, Aspergillus IgE, immunoglobulins, MPO/PR-3, PFTs, methacholine challenge, and chest imaging.  These studies showed peripheral eosinophilia, hyper IgE, low IgG, normal creatinine, normal LFTs, normal Aspergillus IgE, and normal MPO/PR-3.  Skin prick test showed sensitivity to [place] only.  Her PFTs showed a mild obstruction, and methacholine challenge was positive for hyperreactive airway.  Her CT chest demonstrated small nodules, bronchiectasis, tree-in-bud opacities, and areas of endobronchial mucous plugging.  CT maxillofacial sinus demonstrated pansinusitis with secretions.    Today, she describes worsening of her chest congestion and sputum production.  She ran out of her Flovent approximately 9 days ago.  She has been using albuterol scheduled, twice a day.  She coughs up phlegm that is greenish in color.  She has minimal sinus congestion.  She denies any hemoptysis, rashes, or hematuria.         Review of Systems   Constitutional: Negative for chills, diaphoresis, fever and unexpected weight change.   Respiratory: Positive for cough. Negative for shortness of breath.    Cardiovascular: Negative for chest pain and palpitations.   Gastrointestinal: Negative for abdominal pain, constipation, diarrhea, nausea and vomiting.         Objective:         ??? albuterol sulfate (PROAIR HFA) 90 mcg/actuation HFA aerosol inhaler Inhale two puffs by mouth into the lungs every 6 hours as needed for Wheezing or Shortness of Breath.   ??? citalopram hydrobromide (CELEXA PO) Take  by mouth.   ??? fluticasone propionate (FLONASE) 50 mcg/actuation nasal spray, suspension apply 2 sprays to each nostril TWICE DAILY. shake bottle gently before using.   ??? fluticasone propionate (FLOVENT HFA) 220 mcg/actuation inhaler Inhale two puffs by mouth into the lungs daily.   ??? ipratropium bromide (ATROVENT) 42 mcg (0.06 %) nasal spray APPLY TWO SPRAYS TO EACH NOSTRIL AS DIRECTED FOUR TIMES DAILY     Vitals:    10/19/22 1605 10/19/22 1609   BP: (!) 141/88 (!) 140/98   BP Source: Arm, Left Upper Arm, Right Upper   Pulse: 71    Temp: 36.7 ???C (98.1 ???F)    Resp: 16    SpO2: 98%    TempSrc: Oral    PainSc: Zero    Weight: 65.8 kg (145 lb)    Height: 158 cm (5' 2.2)      Body mass index is 26.35 kg/m???.     Physical Exam  Constitutional:       General: She is not in acute distress.     Appearance: She is not ill-appearing or toxic-appearing.   HENT:      Head: Normocephalic and atraumatic.      Nose: Nose normal.      Mouth/Throat:      Mouth: Mucous membranes are  moist.      Pharynx: Oropharynx is clear.   Eyes:      Conjunctiva/sclera: Conjunctivae normal.   Cardiovascular:      Rate and Rhythm: Normal rate and regular rhythm.      Heart sounds: No murmur heard.     No friction rub. No gallop.   Pulmonary:      Effort: Pulmonary effort is normal. No respiratory distress.      Breath sounds: Normal breath sounds. No wheezing or rales.   Abdominal:      General: Abdomen is flat.      Palpations: Abdomen is soft.      Tenderness: There is no abdominal tenderness.   Neurological:      General: No focal deficit present.      Mental Status: She is alert and oriented to person, place, and time. Mental status is at baseline.               Latest Reference Range & Units 04/08/22 16:54 06/29/22 14:44   Eosinophils 0 - 5 % 14 (H) 9 (H)   Absolute Eosinophil Count 0 - 0.45 K/UL 0.84 (H) 0.50 (H)   (H): Data is abnormally high     Latest Reference Range & Units 04/08/22 16:54   Total IgE  2,521 (H)   Aspergillus Fumigatus IgE  <0.10   Reference range: <0.35   Unit: [place]/L    Aspergillus Fumigatus Class IgE  0   Aspergillus Fumigatus IgG  2.5   Myeloperoxidase AB <1.0 AI <0.2   Serine Protease3 AB <1.0 AI <0.2   IgG 762 - 1,488 MG/DL 161 (L)   IgM 38 - 096 MG/DL 045   IgA 70 - 409 MG/DL 811   (H): Data is abnormally high  (L): Data is abnormally low      PFT personally reviewed and interpreted:   Pulmonary function test, 05-05-2022: Mild obstructive ventilatory defect, normal diffusing capacity  Methacholine challenge, 05-05-2022: Moderate to severe hyperactive airway response to bronchoprovocation challenge      Radiology imaging personally reviewed and interpreted:  CT chest 03-30-2022:  1.  Bronchiectasis with some endobronchial mucous plugging  2.  Minimal tree-in-bud opacities  3.  Minimal subsegmental atelectasis  4.  Aberrant right subclavian artery      Outside records reviewed:            Assessment & Plan   Heather Gilbert is a very pleasant 61 y.o. female presenting for further management of her pulmonary issues.    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  It is reassuring that her MPO/PR-3 and Aspergillus IgE studies were normal, and suggests against an EGPA or ABPA process.  I refilled her Flovent she may continue albuterol as needed.  For shortness of breath or chest congestion worsens in the future, may consider transitioning to Advair.  May consider rheumatology or ENT referral if develops systemic symptoms worsening sinus symptoms.    - Strongyloides antibody ordered  - Refilled Flovent  - Continue albuterol    # Multiple subcentimeter pulmonary nodules  - Repeat CT chest in 12 months (approximately April 2024)    Complexity: moderate  review/order lab tests, review/order radiology, discussion of results with performing physician and review/interpretation of images/stains/EKG/histopathology  new problem to  examiner with additional work-up    Return visit:  in 5 month(s).    Gasper Lloyd, MD  Assistant Profressor of Medicine  Pulmonary and Critical Care Physician  10/19/2022

## 2022-10-19 NOTE — Patient Instructions
Clinic Visit Summary:     Today we discussed your asthma and shortness of breath in pulmonary clinic.  1) We refilled the Flovent.  2) We also ordered a repeat CT chest for approximately April 2024.  3) Please call [phone] to schedule    It was a pleasure to see you today, please don't hesitate to call with any future questions or concerns.      For questions or concerns, please contact my Clinical Nurse Coordinator, Irineo Axon at [phone] or send a My Chart message.      Please contact my nurse with any signs and symptoms of worsening productive cough with thick secretions, blood in sputum, chest pain or tightness, shortness of breath, fever, chills, night sweats, or any other pulmonary concerns.     -  For URGENT issues after business hours, weekends, or holidays: Call [phone] and request for the Pulmonary Fellow to be paged.    -  For medication refills, please ask your pharmacy to send an electronic request.  Please allow at least 3 business days for medication refills.      -  For equipment issues, please contact your Durable Medical Equipment (DME) company directly.    -  To cancel, change, or schedule a clinic appointment: Call Pulmonary Scheduling at [phone].

## 2022-10-20 DIAGNOSIS — J4540 Moderate persistent asthma, uncomplicated: Secondary | ICD-10-CM

## 2022-12-18 ENCOUNTER — Encounter: Admit: 2022-12-18 | Discharge: 2022-12-18 | Payer: Private Health Insurance - Indemnity

## 2022-12-18 DIAGNOSIS — J4540 Moderate persistent asthma, uncomplicated: Secondary | ICD-10-CM

## 2022-12-18 MED ORDER — ARNUITY ELLIPTA 200 MCG/ACTUATION IN DSDV
1 | Freq: Every day | RESPIRATORY_TRACT | 2 refills | Status: AC
Start: 2022-12-18 — End: ?

## 2022-12-18 NOTE — Telephone Encounter
RN attempted to reach patient to discuss need to change inhaler to formulary alternative. LVM that Dr. Ovid Curd would like to switch to [place]. RN will send to [facility]. RN recommended patient ask Pharmacist to demonstrate use when picking up medication. RN encouraged patient to call back with any questions.

## 2022-12-18 NOTE — Telephone Encounter
RN received PA request for Flovent.    It appears this is no longer covered on insurance formulary. Possible covered alternatives include QVAR HFA and Arnuity Ellipta.     RN routing to Dr. Ovid Curd for recommendation.

## 2022-12-18 NOTE — Telephone Encounter
Dr. Ovid Curd messaged RN that he prefers to switch to [place].     Patient last seen in clinic in Nov 2023, and is scheduled for FU in early April 2024.     RN will send order to [facility].

## 2023-01-04 ENCOUNTER — Encounter: Admit: 2023-01-04 | Discharge: 2023-01-04 | Payer: Private Health Insurance - Indemnity

## 2023-01-04 DIAGNOSIS — J45901 Unspecified asthma with (acute) exacerbation: Secondary | ICD-10-CM

## 2023-01-04 MED ORDER — AZITHROMYCIN 250 MG PO TAB
ORAL_TABLET | ORAL | 0 refills | Status: AC
Start: 2023-01-04 — End: ?

## 2023-01-04 MED ORDER — ALBUTEROL SULFATE 2.5 MG /3 ML (0.083 %) IN NEBU
2.5 mg | RESPIRATORY_TRACT | 2 refills | Status: AC | PRN
Start: 2023-01-04 — End: ?

## 2023-01-04 MED ORDER — PREDNISONE 20 MG PO TAB
40 mg | ORAL_TABLET | Freq: Every day | ORAL | 0 refills | Status: AC
Start: 2023-01-04 — End: ?

## 2023-01-11 ENCOUNTER — Ambulatory Visit: Admit: 2023-01-11 | Discharge: 2023-01-11 | Payer: Private Health Insurance - Indemnity

## 2023-01-11 ENCOUNTER — Encounter: Admit: 2023-01-11 | Discharge: 2023-01-11 | Payer: Private Health Insurance - Indemnity

## 2023-01-11 DIAGNOSIS — F419 Anxiety disorder, unspecified: Secondary | ICD-10-CM

## 2023-01-11 DIAGNOSIS — R0602 Shortness of breath: Secondary | ICD-10-CM

## 2023-01-11 DIAGNOSIS — J42 Unspecified chronic bronchitis: Secondary | ICD-10-CM

## 2023-01-11 DIAGNOSIS — J471 Bronchiectasis with (acute) exacerbation: Secondary | ICD-10-CM

## 2023-01-11 DIAGNOSIS — J329 Chronic sinusitis, unspecified: Secondary | ICD-10-CM

## 2023-01-11 DIAGNOSIS — J189 Pneumonia, unspecified organism: Secondary | ICD-10-CM

## 2023-01-11 DIAGNOSIS — J45909 Unspecified asthma, uncomplicated: Secondary | ICD-10-CM

## 2023-01-11 DIAGNOSIS — C801 Malignant (primary) neoplasm, unspecified: Secondary | ICD-10-CM

## 2023-01-11 DIAGNOSIS — R053 Chronic cough: Secondary | ICD-10-CM

## 2023-01-11 NOTE — Patient Instructions
Clinic Visit Summary:             For questions or concerns,  please leave a voicemail for my Clinical Nurse Coordinator,  Ivone Licht at [phone] or send a My Chart message.      For URGENT issues after business hours, weekends, or holidays: Call [phone] and request for the Pulmonary Fellow to be paged.  For medication refills, please ask your pharmacy to send an electronic request.  Please allow at least 3 business days for medication refills.    For equipment issues, please contact your Durable Medical Equipment (DME) company directly.  To cancel, change, or schedule a clinic appointment: Call Pulmonary Scheduling at [phone].

## 2023-01-11 NOTE — Progress Notes
Date of Service: 01/11/2023    Subjective:             Heather Gilbert is a very pleasant 62 y.o. female hyper IgE, hypogammaglobulinemia, sinusitis, asthma, chronic cough, shortness of breath presenting for further management of her pulmonary issues.    History of Present Illness    She describes acute worsening of her shortness of breath and cough on January 5th when she was diagnosed with COVID.  She did not require hospitalization.  Since then she had worsening phlegm and sputum production.  She initially was treated with an outpatient course of prednisone and azithromycin with partial relief of her symptoms.  She then developed sinusitis and was provided Amoxil by her primary care.  Her symptoms worsened after completing her therapies and she was started on a longer course of prednisone and Levaquin.  She felt significantly better over the last 4 days.  She is still taking Flovent and will be transitioning to Arnuity.  She was using her albuterol up to 4 times a day; however, she has not needed it today.  She denies any hemoptysis.    As part of her work-up she has undergone a CBC with differential,  CMP, Aspergillus IgE, immunoglobulins, MPO/PR-3, PFTs, methacholine challenge, and chest imaging.  These studies showed peripheral eosinophilia, hyper IgE, low IgG, normal creatinine, normal LFTs, normal Aspergillus IgE, and normal MPO/PR-3.  Skin prick test showed sensitivity to [place] only.  Her PFTs showed a mild obstruction, and methacholine challenge was positive for hyperreactive airway.  Her CT chest demonstrated small nodules, bronchiectasis, tree-in-bud opacities, and areas of endobronchial mucous plugging.  CT maxillofacial sinus demonstrated pansinusitis with secretions.              Review of Systems   Constitutional:  Negative for chills, diaphoresis, fever and unexpected weight change.   Respiratory:  Positive for cough and shortness of breath.    Cardiovascular:  Negative for chest pain and palpitations.   Gastrointestinal:  Negative for abdominal pain, constipation, diarrhea, nausea and vomiting.         Objective:          albuterol 0.083% (PROVENTIL) 2.5 mg /3 mL (0.083 %) nebulizer solution Inhale 3 mL solution by nebulizer as directed every 6 hours as needed for Wheezing or Shortness of Breath. Indications: asthma attack    albuterol sulfate (PROAIR HFA) 90 mcg/actuation HFA aerosol inhaler Inhale two puffs by mouth into the lungs every 6 hours as needed for Wheezing or Shortness of Breath.    citalopram hydrobromide (CELEXA PO) Take  by mouth.    fluticasone furoate (ARNUITY ELLIPTA) 200 mcg/actuation disk inhaler Inhale one puff by mouth into the lungs daily.    fluticasone propionate (FLONASE) 50 mcg/actuation nasal spray, suspension apply 2 sprays to each nostril TWICE DAILY. shake bottle gently before using.    fluticasone propionate (FLOVENT HFA) 220 mcg/actuation inhaler Inhale two puffs by mouth into the lungs daily.    ipratropium bromide (ATROVENT) 42 mcg (0.06 %) nasal spray APPLY TWO SPRAYS TO EACH NOSTRIL AS DIRECTED FOUR TIMES DAILY    levoFLOXacin (LEVAQUIN) 500 mg tablet Take one tablet by mouth daily for 7 days.    predniSONE (DELTASONE) 10 mg tablet Take four tablets by mouth daily for 5 days, THEN two tablets daily for 3 days, THEN one tablet daily for 3 days.     Vitals:    01/11/23 1338   BP: 124/83   Pulse: 91   Temp: 37.1 ?  C (98.8 ?F)   SpO2: 94%   PainSc: Zero   Weight: 60.3 kg (132 lb 14.4 oz)   Height: 158.8 cm (5' 2.5)     Body mass index is 23.92 kg/m?Marland Kitchen     Physical Exam  Constitutional:       General: She is not in acute distress.     Appearance: She is not ill-appearing or toxic-appearing.   HENT:      Head: Normocephalic and atraumatic.      Nose: Nose normal.      Mouth/Throat:      Mouth: Mucous membranes are moist.      Pharynx: Oropharynx is clear.   Eyes:      Conjunctiva/sclera: Conjunctivae normal.   Cardiovascular:      Rate and Rhythm: Normal rate and regular rhythm.      Heart sounds: No murmur heard.     No friction rub. No gallop.   Pulmonary:      Effort: Pulmonary effort is normal. No respiratory distress.      Breath sounds: Normal breath sounds. No wheezing or rales.   Abdominal:      General: Abdomen is flat.      Palpations: Abdomen is soft.      Tenderness: There is no abdominal tenderness.   Neurological:      General: No focal deficit present.      Mental Status: She is alert and oriented to person, place, and time. Mental status is at baseline.                Latest Reference Range & Units 04/08/22 16:54 06/29/22 14:44   Eosinophils 0 - 5 % 14 (H) 9 (H)   Absolute Eosinophil Count 0 - 0.45 K/UL 0.84 (H) 0.50 (H)   (H): Data is abnormally high       Latest Reference Range & Units 04/08/22 16:54   Total IgE   2,521 (H)   Aspergillus Fumigatus IgE   <0.10   Reference range: <0.35   Unit: [place]/L    Aspergillus Fumigatus Class IgE   0   Aspergillus Fumigatus IgG   2.5   Myeloperoxidase AB <1.0 AI <0.2   Serine Protease3 AB <1.0 AI <0.2   IgG 762 - 1,488 MG/DL 161 (L)   IgM 38 - 096 MG/DL 045   IgA 70 - 409 MG/DL 811   (H): Data is abnormally high  (L): Data is abnormally low        PFT personally reviewed and interpreted:   05-05-2022: Mild OVD, normal diffusing capacity    05-05-2022, methacholine challenge: Moderate to severe hyperactive airway response to bronchial provocation challenge      Radiology imaging personally reviewed and interpreted:  Chest x-ray, 01-08-2023: possible lingular infiltrate    CT chest 03-30-2022:  1.  Bronchiectasis with some endobronchial mucous plugging  2.  Minimal tree-in-bud opacities  3.  Minimal subsegmental atelectasis  4.  Aberrant right subclavian artery    Assessment & Plan   Heather Gilbert is a very pleasant 62 y.o. female presenting for further management of her pulmonary issues.    # Acute exacerbations of her asthma and bronchiectasis  # Moderate persistent asthma, not-well controlled  # Shortness of breath  # Chronic cough  # Hyper IgE  # Peripheral eosinophilia  # Pansinusitis    Overall, he is recovering from an acute exacerbation of her asthma and bronchiectasis triggered by COVID.  She reports feeling significantly better today but  not back to her baseline.  We will continue the prednisone taper and finish the course of Levaquin.  If her symptoms persist or worsen, she may warrant getting a CT chest for further evaluation of the lung parenchyma.    - Continue prednisone taper  - Continue Levaquin  - Continue inhaled fluticasone and as needed albuterol      #Multiple subcentimeter pulmonary nodules  - CT chest ordered for April 2024      Complexity: moderate  review/order lab tests, review/order radiology, review/order other diagnostic test, discussion of results with performing physician, and review/interpretation of images/stains/EKG/histopathology  new problem to examiner with additional work-up      Return visit:  in 2 month(s) or sooner if needed    Gasper Lloyd, MD  Assistant Professor of Medicine  Pulmonary and Critical Care Physician  01/11/2023

## 2023-03-22 ENCOUNTER — Encounter: Admit: 2023-03-22 | Discharge: 2023-03-22 | Payer: Private Health Insurance - Indemnity

## 2023-04-12 ENCOUNTER — Encounter: Admit: 2023-04-12 | Discharge: 2023-04-12 | Payer: Private Health Insurance - Indemnity

## 2023-04-12 ENCOUNTER — Ambulatory Visit: Admit: 2023-04-12 | Discharge: 2023-04-13 | Payer: Private Health Insurance - Indemnity

## 2023-04-12 DIAGNOSIS — J479 Bronchiectasis, uncomplicated: Secondary | ICD-10-CM

## 2023-04-12 DIAGNOSIS — R911 Solitary pulmonary nodule: Secondary | ICD-10-CM

## 2023-04-12 DIAGNOSIS — J4540 Moderate persistent asthma, uncomplicated: Secondary | ICD-10-CM

## 2023-04-12 DIAGNOSIS — J45909 Unspecified asthma, uncomplicated: Secondary | ICD-10-CM

## 2023-04-12 DIAGNOSIS — J329 Chronic sinusitis, unspecified: Secondary | ICD-10-CM

## 2023-04-12 DIAGNOSIS — D721 Eosinophilia, unspecified type: Secondary | ICD-10-CM

## 2023-04-12 DIAGNOSIS — C801 Malignant (primary) neoplasm, unspecified: Secondary | ICD-10-CM

## 2023-04-12 DIAGNOSIS — R918 Other nonspecific abnormal finding of lung field: Secondary | ICD-10-CM

## 2023-04-12 DIAGNOSIS — J42 Unspecified chronic bronchitis: Secondary | ICD-10-CM

## 2023-04-12 DIAGNOSIS — R0602 Shortness of breath: Secondary | ICD-10-CM

## 2023-04-12 DIAGNOSIS — F419 Anxiety disorder, unspecified: Secondary | ICD-10-CM

## 2023-04-12 DIAGNOSIS — J189 Pneumonia, unspecified organism: Secondary | ICD-10-CM

## 2023-04-12 MED ORDER — FLUTICASONE PROPIONATE 50 MCG/ACTUATION NA SPSN
2 | Freq: Every day | NASAL | 2 refills | 60.00000 days | Status: AC
Start: 2023-04-12 — End: ?

## 2023-04-12 NOTE — Patient Instructions
Clinic Visit Summary:     Schedule your CT chest.   Please call Radiology Scheduling at [phone] to schedule at your convenience.       For questions or concerns,  please leave a voicemail for my Clinical Nurse Coordinator,  Christophe Louis at [phone] or send a My Chart message.      For URGENT issues after business hours, weekends, or holidays: Call [phone] and request for the Pulmonary Fellow to be paged.  For medication refills, please ask your pharmacy to send an electronic request.  Please allow at least 3 business days for medication refills.    For equipment issues, please contact your Durable Medical Equipment (DME) company directly.  To cancel, change, or schedule a clinic appointment: Call Pulmonary Scheduling at [phone].

## 2023-04-12 NOTE — Progress Notes
Date of Service: 04/12/2023    Subjective:           Heather Gilbert is a very pleasant 62 y.o. female with hyper IgE, hypogammaglobulinemia, sinusitis, asthma, chronic cough, shortness of breath presenting for further management of her pulmonary issues.       History of Present Illness  Since the last visit, she has returned to her normal baseline health.  She denies any exacerbations of her lung issues.  She denies any shortness of breath or cough.  She is using her Arnuity and Flonase, which controls her symptoms.  She also denies any corticosteroid use since January.    As part of her work-up she has undergone a CBC with differential,  CMP, Aspergillus IgE, immunoglobulins, MPO/PR-3, PFT's, methacholine challenge, and chest imaging.  These studies showed peripheral eosinophilia, hyper IgE, low IgG, normal creatinine, normal LFTs, normal Aspergillus IgE, and normal MPO/PR-3.  Skin prick test showed sensitivity to [place] only.  Her PFT's showed a mild obstruction, and methacholine challenge was positive for hyperreactive airway.  Her CT chest demonstrated small nodules, bronchiectasis, tree-in-bud opacities, and areas of endobronchial mucous plugging.  CT maxillofacial sinus demonstrated pansinusitis with secretions.          Review of Systems   Constitutional:  Negative for chills, diaphoresis, fever and unexpected weight change.   Respiratory:  Negative for cough and shortness of breath.    Cardiovascular:  Negative for chest pain and palpitations.   Gastrointestinal:  Negative for abdominal pain, constipation, diarrhea, nausea and vomiting.         Objective:          albuterol 0.083% (PROVENTIL) 2.5 mg /3 mL (0.083 %) nebulizer solution Inhale 3 mL solution by nebulizer as directed every 6 hours as needed for Wheezing or Shortness of Breath. Indications: asthma attack    albuterol sulfate (PROAIR HFA) 90 mcg/actuation HFA aerosol inhaler Inhale two puffs by mouth into the lungs every 6 hours as needed for Wheezing or Shortness of Breath.    citalopram hydrobromide (CELEXA PO) Take  by mouth.    fluticasone furoate (ARNUITY ELLIPTA) 200 mcg/actuation disk inhaler Inhale one puff by mouth into the lungs daily.    fluticasone propionate (FLONASE) 50 mcg/actuation nasal spray, suspension apply 2 sprays to each nostril TWICE DAILY. shake bottle gently before using.    fluticasone propionate (FLOVENT HFA) 220 mcg/actuation inhaler Inhale two puffs by mouth into the lungs daily.    ipratropium bromide (ATROVENT) 42 mcg (0.06 %) nasal spray APPLY TWO SPRAYS TO EACH NOSTRIL AS DIRECTED FOUR TIMES DAILY     Vitals:    04/12/23 1504   BP: (!) 142/96   BP Source: Arm, Right Upper   Pulse: 70   Temp: 36.8 ?C (98.2 ?F)   Resp: 18   SpO2: 98%   TempSrc: Oral   PainSc: Zero   Weight: 67.4 kg (148 lb 9.6 oz)   Height: 160 cm (5' 3)     Body mass index is 26.32 kg/m?Marland Kitchen     Physical Exam  Constitutional:       General: She is not in acute distress.     Appearance: She is not ill-appearing or toxic-appearing.   HENT:      Head: Normocephalic and atraumatic.      Nose: Nose normal.      Mouth/Throat:      Mouth: Mucous membranes are moist.      Pharynx: Oropharynx is clear.   Eyes:  Conjunctiva/sclera: Conjunctivae normal.   Cardiovascular:      Rate and Rhythm: Normal rate and regular rhythm.      Heart sounds: No murmur heard.     No friction rub. No gallop.   Pulmonary:      Effort: Pulmonary effort is normal. No respiratory distress.      Breath sounds: Normal breath sounds. No wheezing or rales.   Abdominal:      General: Abdomen is flat.      Palpations: Abdomen is soft.      Tenderness: There is no abdominal tenderness.   Neurological:      General: No focal deficit present.      Mental Status: She is alert and oriented to person, place, and time. Mental status is at baseline.               Latest Reference Range & Units 04/08/22 16:54 06/29/22 14:44   Eosinophils 0 - 5 % 14 (H) 9 (H)   Absolute Eosinophil Count 0 - 0.45 K/UL 0.84 (H) 0.50 (H)   (H): Data is abnormally high         Latest Reference Range & Units 04/08/22 16:54 06/29/22 14:44   Total IgE  2,521 (H)    Aspergillus Fumigatus IgE  <0.10    Aspergillus Fumigatus Class IgE  0    Aspergillus Fumigatus IgG  2.5    Myeloperoxidase AB <1.0 AI <0.2    Serine Protease3 AB <1.0 AI <0.2    IgG 762 - 1,488 MG/DL 811 (L) 914 (L)   IgM 38 - 328 MG/DL 782    IgE <956 IU/mL  >2500 (H)   IgA 70 - 390 MG/DL 213    (H): Data is abnormally high  (L): Data is abnormally low      PFT personally reviewed and interpreted:   05-06-2022: mild OVD, no air trapping, normal diffusing capacity    05-18-2022: moderate to severe hyperactive airway response to bronchial provocation challenge    Radiology imaging personally reviewed and interpreted:  03-30-2022, CT chest:  1.  Abberrant right subclavian artery  2.  Bronchiectasis with areas of endobronchial mucus and debris  3.  Tiny pulmonary nodules  4.  Faint areas of subsegmental atelectasis    01-08-2023, chest x-ray:  1.  Possible left lung infiltrate      Outside records reviewed:  03-30-2022, CT chest:        Assessment & Plan   Heather Gilbert is a very pleasant 62 y.o. female presenting for further management of her pulmonary issues.    # Shortness of breath, stable  # Bronchiectasis  # Moderate persistent asthma  # Hyper IgE  # Peripheral eosinophilia  # Pansinusitis  Overall, she is well-controlled.  We will continue her Arnuity Ellipta and as needed albuterol for now.  We refilled her Flonase.    # Pulmonary nodules  A CT chest from April 2023 showed occasional pulmonary nodules.  She has history of tobacco use in the distant past, so we will have a follow-up CT chest to confirm stability.  - Follow up CT chest ordered    Complexity: moderate  review/order lab tests, review/order radiology, review/order other diagnostic test, and review/interpretation of images/stains/EKG/histopathology  new problem to examiner with additional work-up      Return visit:  in 6 month(s) with spirometry or sooner if needed.    Gasper Lloyd, MD  Assistant Professor of Medicine  Pulmonary and Critical Care Physician  04/12/2023

## 2023-04-13 DIAGNOSIS — J310 Chronic rhinitis: Secondary | ICD-10-CM

## 2023-07-21 IMAGING — CR PAPERORDER
2 series · 2 of 2 positions shown · non-contrast
Comparison: none

[chest pa]
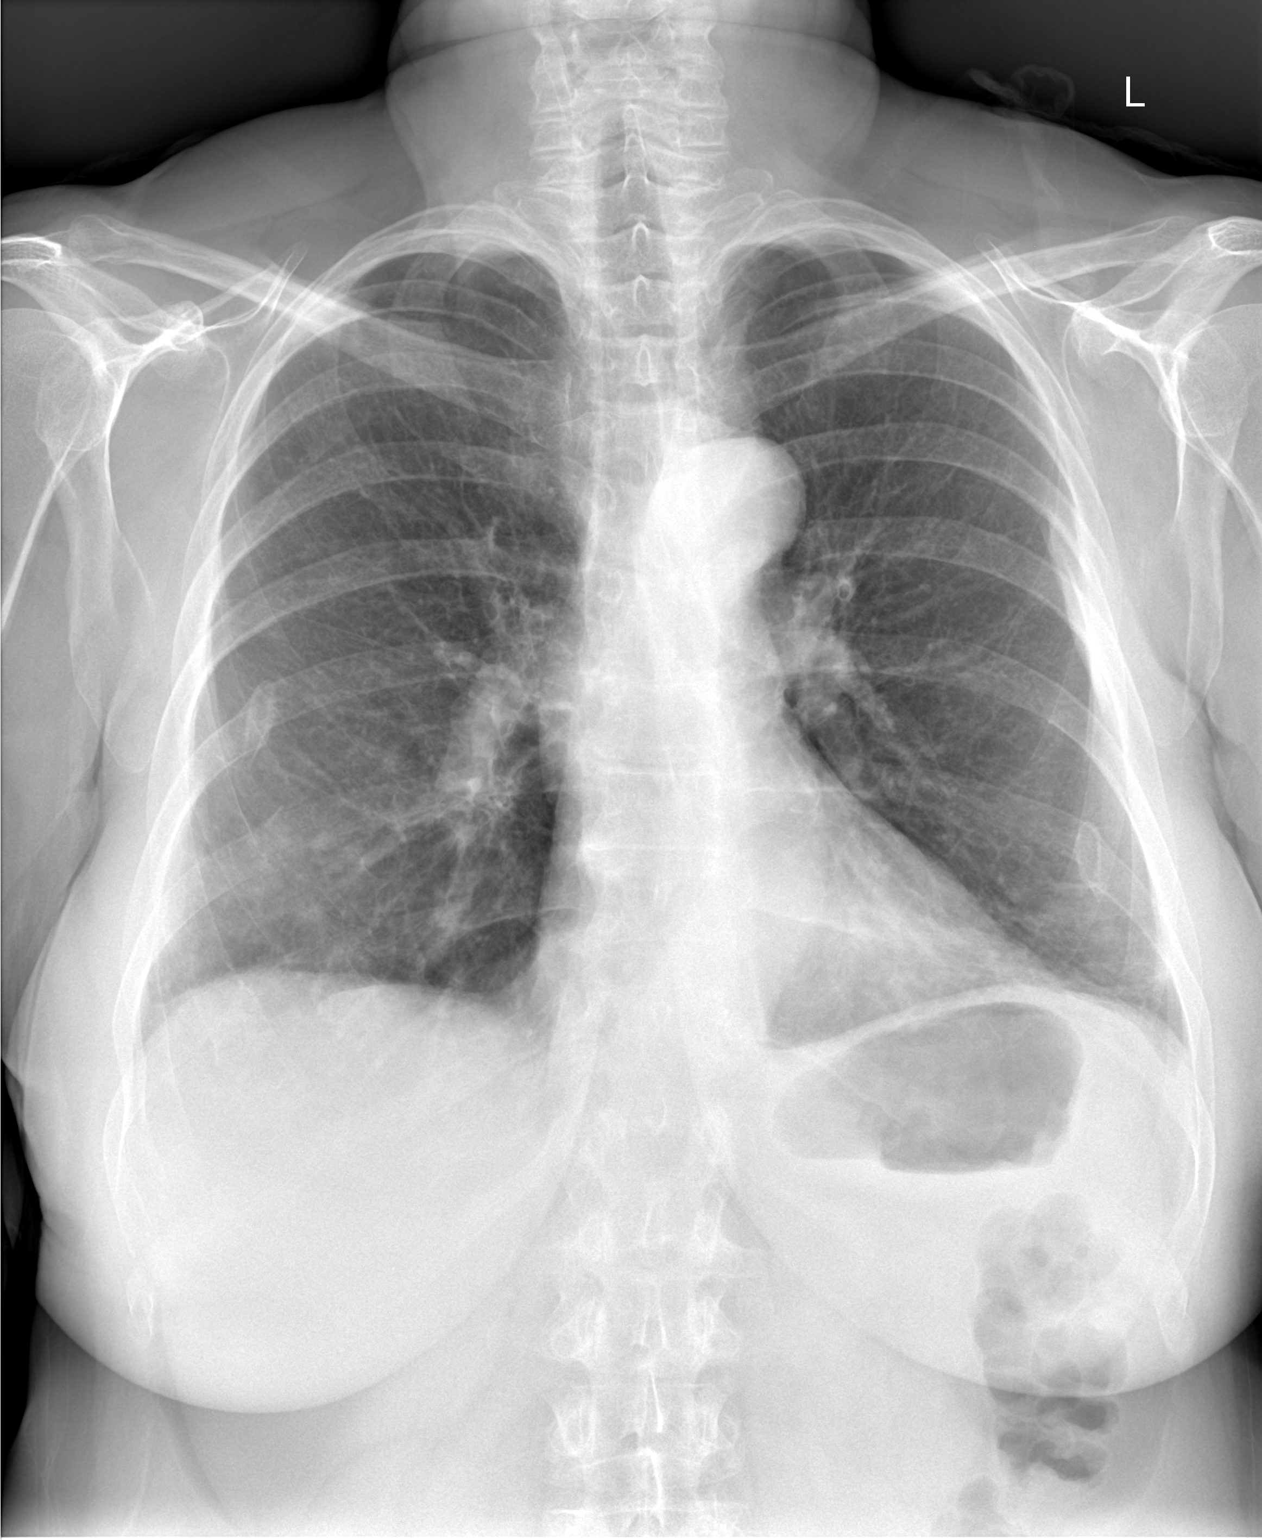

[chest lat]
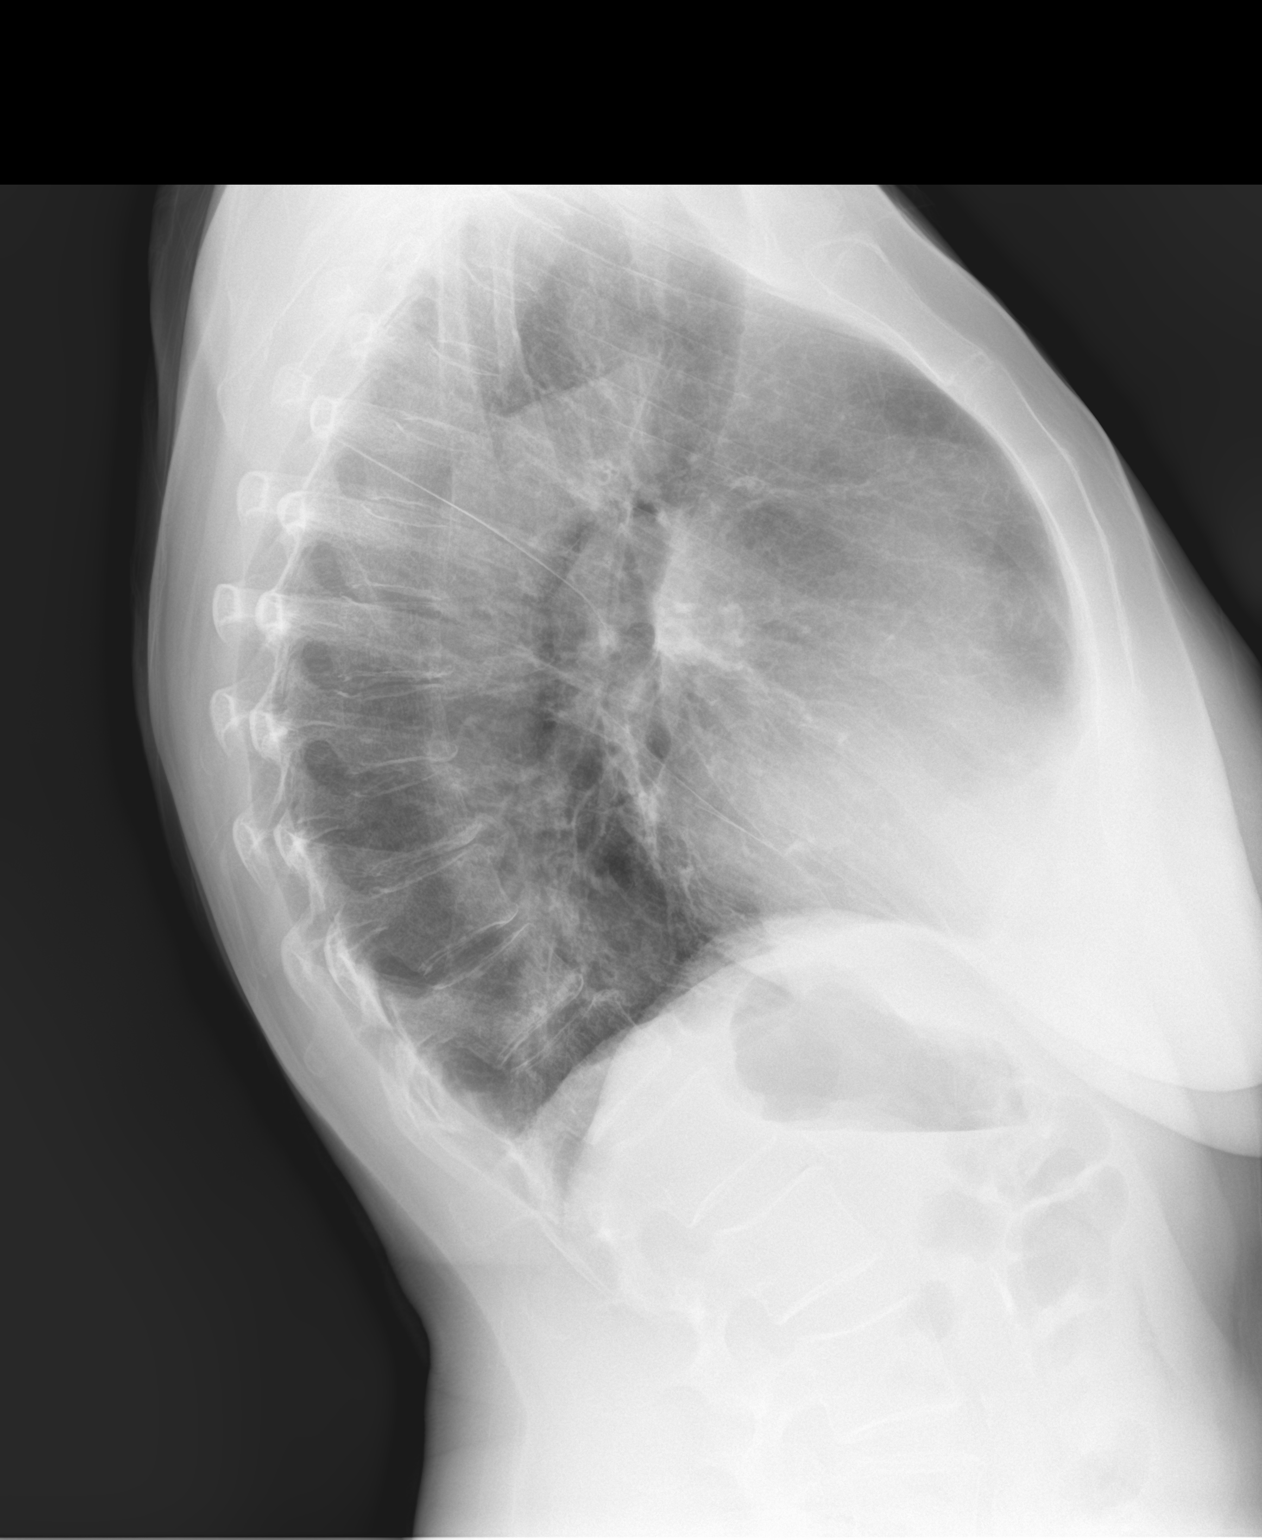

[2 of 2 positions shown; findings below may reference images not displayed]

EXAM

XR chest 2V

INDICATION

CHRONIC COUGH,EOSINOPHILIA
PT C/O PRODUCTIVE COUGH WITH GREEN PHLEGM.  COUGH WITH FEVER, BODY ACHES, FATIGUE, SOA X 6 WEEKS.
AB

TECHNIQUE

XR chest 2V

COMPARISONS

October 16, 2021

FINDINGS

Mild left basilar atelectasis without dense consolidation. No pleural FU or pneumothorax. The heart
size is normal. Pulmonary vasculature is unremarkable. No acute osseous findings. Old, healed
bilateral rib fractures.

IMPRESSION

Left basilar atelectasis without dense consolidation.

Tech Notes:

PT C/O PRODUCTIVE COUGH WITH GREEN PHLEGM.  COUGH WITH FEVER, BODY ACHES, FATIGUE, SOA X 6 WEEKS.
AB

## 2023-07-28 ENCOUNTER — Encounter: Admit: 2023-07-28 | Discharge: 2023-07-28 | Payer: Private Health Insurance - Indemnity

## 2023-07-28 DIAGNOSIS — J4540 Moderate persistent asthma, uncomplicated: Secondary | ICD-10-CM

## 2023-07-28 MED ORDER — ARNUITY ELLIPTA 200 MCG/ACTUATION IN DSDV
1 | Freq: Every day | RESPIRATORY_TRACT | 5 refills | Status: AC
Start: 2023-07-28 — End: ?

## 2023-08-25 ENCOUNTER — Encounter: Admit: 2023-08-25 | Discharge: 2023-08-25 | Payer: Private Health Insurance - Indemnity

## 2023-08-25 MED ORDER — IPRATROPIUM BROMIDE 42 MCG (0.06 %) NA SPRY
2 | Freq: Four times a day (QID) | NASAL | 0 refills | 30.00000 days | Status: AC
Start: 2023-08-25 — End: ?

## 2023-08-25 NOTE — Telephone Encounter
Pharmacy requesting a refill of ipratropium bromide (ATROVENT) 42 mcg (0.06 %) nasal spray . Patient last seen 10/14/2022. Follow up scheduled 09/15/2023. Per last OV note, - Continue Atrovent 2 sprays 2-4 times daily as needed for PND. Marland Kitchen Refilling atrovent per Allergy/Immunology Standing Orders Protocol for 2 months.

## 2023-09-15 ENCOUNTER — Encounter: Admit: 2023-09-15 | Discharge: 2023-09-15 | Payer: Private Health Insurance - Indemnity

## 2023-09-15 ENCOUNTER — Ambulatory Visit: Admit: 2023-09-15 | Discharge: 2023-09-15 | Payer: Private Health Insurance - Indemnity

## 2023-09-15 ENCOUNTER — Ambulatory Visit: Admit: 2023-09-15 | Discharge: 2023-09-16 | Payer: Private Health Insurance - Indemnity

## 2023-09-15 DIAGNOSIS — J42 Unspecified chronic bronchitis: Secondary | ICD-10-CM

## 2023-09-15 DIAGNOSIS — J45909 Unspecified asthma, uncomplicated: Secondary | ICD-10-CM

## 2023-09-15 DIAGNOSIS — J329 Chronic sinusitis, unspecified: Secondary | ICD-10-CM

## 2023-09-15 DIAGNOSIS — C801 Malignant (primary) neoplasm, unspecified: Secondary | ICD-10-CM

## 2023-09-15 DIAGNOSIS — F419 Anxiety disorder, unspecified: Secondary | ICD-10-CM

## 2023-09-15 DIAGNOSIS — D801 Nonfamilial hypogammaglobulinemia: Secondary | ICD-10-CM

## 2023-09-15 DIAGNOSIS — J189 Pneumonia, unspecified organism: Secondary | ICD-10-CM

## 2023-09-15 DIAGNOSIS — J324 Chronic pansinusitis: Secondary | ICD-10-CM

## 2023-09-15 DIAGNOSIS — D721 Eosinophilia, unspecified type: Secondary | ICD-10-CM

## 2023-09-15 DIAGNOSIS — J4540 Moderate persistent asthma, uncomplicated: Secondary | ICD-10-CM

## 2023-09-15 LAB — URINALYSIS DIPSTICK
GLUCOSE,UA: NEGATIVE
NITRITE: NEGATIVE
PROTEIN,UA: NEGATIVE
URINE ASCORBIC ACID, UA: NEGATIVE
URINE BILE: NEGATIVE
URINE KETONE: NEGATIVE
URINE PH: 7 (ref 5.0–8.0)
URINE SPEC GRAVITY: 1 — ABNORMAL LOW (ref 1.005–1.030)

## 2023-09-15 LAB — URINALYSIS, MICROSCOPIC

## 2023-09-15 LAB — CBC AND DIFF
HEMATOCRIT: 42 % (ref 36–45)
RBC COUNT: 4.5 M/UL (ref 4.0–5.0)
WBC COUNT: 5.9 10*3/uL (ref 4.5–11.0)

## 2023-09-15 LAB — IMMUNOGLOBULINS-IGA,IGG,IGM
IGA: 260 mg/dL (ref 70–390)
IGG: 816 mg/dL (ref 762–1488)
IGM: 152 mg/dL (ref 38–328)

## 2023-09-15 LAB — IMMUNOGLOBULIN E (IGE): IGE: >25 [IU]/mL — ABNORMAL HIGH (ref <101)

## 2023-09-15 NOTE — Progress Notes
Date of Service: 07/01/2022    Heather Gilbert is a 62 y.o. female with h/o eosinophilia; chronic sinusitis; chronic bronchitis who presents today to the Allergy/Immunology clinic for a follow-up visit.       History of Present Illness  #Recurrent sinusitis  #Hyper IgE  #History of eosinophilia- resolved  #Hypogammaglobulinemia  Several year history of recurrent sinusitis and bronchitis, which resolved with antibiotics; however, was never back to her baseline. Thought process was she could have some immunologic component of her recurrent infections given her low IgG and hyper IgE and previous eosinophilia.     Following her last visit in 04/2022, we had her obtain multiple labs in evaluation of her signs and symptoms. Recent labs on 06/2022 notable was eosinophils 9% on CBC both show calculated normal eosinophils, a significantly elevated IgE > 2500, and a slightly low IgG of 677 (improved slight from 656).    CT sinuses on 04/2022 significant for 1. Pansinusitis. 2. Secretions in right frontal sinus. 3. Hiatal complexes bilaterally S/p 21 days of Augmentin with resolution of symptoms after completion.      Latest Reference Range & Units 04/08/22 16:54 06/29/22 14:44   Total IgE  2,521 (H)    Aspergillus Fumigatus IgE  <0.10  Reference range: <0.35  Unit: [place]/L      Aspergillus Fumigatus Class IgE  0    Aspergillus Fumigatus IgG  2.5    Myeloperoxidase AB <1.0 AI <0.2    Serine Protease3 AB <1.0 AI <0.2    IgG 762 - 1,488 MG/DL 098 (L) 119 (L)   IgM 38 - 328 MG/DL 147    IgE <829 IU/mL  >2500 (H)   IgA 70 - 390 MG/DL 562    (H): Data is abnormally high  (L): Data is abnormally low     Latest Reference Range & Units 04/08/22 16:54 06/29/22 14:44   Eosinophils 0 - 5 % 14 (H) 9 (H)   Absolute Eosinophil Count 0 - 0.45 K/UL 0.84 (H) 0.50 (H)   (H): Data is abnormally high       From a health and infectious stand point she denies any recent infection since last visit (no bacterial infections including cellulitis, ear infection, mastoiditis, sinusitis, pneumonia etc)  She denies any B symptoms.  She is feeling well and has not changes in her health.   Denies any respiratory symptoms, and is an avid golfer.     #Moderate persistent asthma   Diagnosed 2022, follows with pulmonology for management.   Currently on Arnuity 1 puff daily which improved symptoms. She reports improvement of her symptoms since last visit.   04/2022 Complete PFTs with methacholine challenge, which showed decreased FEV1/FVC ratio of 66%, FEF 25-75% reduced value of 43%, and 21% decrease in FEV1 upon exposure to 0.5 mg/ml methacholine.        Review of Systems  A 12-point review of systems has been reviewed and the remainder are all negative other than as noted above and as in the HPI.  Any chronic issues are being addressed by a variety of physicians other than as is noted in the HPI.    Medications   citalopram hydrobromide (CELEXA PO) Take  by mouth.    doxycycline hyclate (VIBRACIN) 100 mg tablet     fluticasone propionate (FLONASE ALLERGY RELIEF) 50 mcg/actuation nasal spray, suspension Apply two sprays to each nostril as directed twice daily. Shake bottle gently before using.    fluticasone propionate (FLOVENT HFA) 220 mcg/actuation inhaler  Inhale two puffs by mouth into the lungs daily.    ipratropium bromide (ATROVENT) 42 mcg (0.06 %) nasal spray Apply two sprays to each nostril as directed four times daily.     Allergies   Allergen Reactions    Tree Pollen-[place] SEE COMMENTS     frequent     Vitals:    10/14/22 1531   BP: (!) 136/93   BP Source: Arm, Left Upper   Pulse: 78   Temp: 37.2 ?C (99 ?F)   TempSrc: Oral   PainSc: Zero   Weight: 65.8 kg (145 lb)  Comment: Per pt   Height: 158.8 cm (5' 2.5)     Body mass index is 26.1 kg/m?Marland Kitchen     Physical Exam  General: Alert, cooperative, and in no acute distress.  Eye: PER, EOMI.  No scleral icterus.  Clear conjunctiva.  HENT: Normocephalic.  Normal external ear structures. Moist mucous membranes.     CV: Regular rate and rhythm.     Pulm: No wheezes or crackles.     Skin: Warm and dry.  Normal color and texture.  No rashes or lesions on exposed skin.    MSK: No joint abnormalities.  Grossly normal ROM.    Neuro: Motor and sensation grossly intact.  CNs grossly intact.     Assessment and Plan:    #chronic pansinusitis  #history of recurrent sinusitis  #hx of eosinophilia- resolved  #hyper IgE   #hypogammaglobulinemia    Recurrent sinusitis has been a problem for many years, as well as recurrent bronchitis.  Since Nov 2022 when she was hospitalized with pneumonia, chronic cough has been an issue.  Symptoms had not resolved despite multiple rounds of antibiotics and steroids since then.      Reported eosinophilia and hypogammaglobulinemia in the presence of chronic cough and sinusitis hints at the potential for an immunological process that could be at least contributing to her symptoms.  Vasculitis is also considered given sinusitis and persistent cough that has been productive of blood-streaked mucus in the past.  ABPA was considered, again given the persistence of symptoms and the patient's report of formed pieces of dark colored mucus and CT chest showing mucus plugging, but testing was negative.       04/08/22 - Labwork notable for a slightly elevated eosinophil count of 840 (down from 1260), a significantly elevated IgE count of over 2500, and a slightly low IgG count of 656.     06/2022- Labwork notable for normalized eosinophils, a significantly elevated IgE count of over 2500, and a slightly low IgG count of 677 (improved slight from 656).     05/05/22 - PFT with decreased FEV1/FVC of 66%, FEF 25-75% reduced value of 43%, and 21% decrease in FEV1 upon exposure to 0.5 mg/ml methacholine.  +reactive airway disease     05/06/22 - SPT positive only for sensitization to [facility], which would not explain persistent symptoms since last fall.     07/01/2022  URI symptoms (increased productive cough, postnasal drip, sinus pressure, wheezing, SOB) along with untreated pansinusitis seen on CT 04/2022. Resolution of symptoms s/p 21 days Augmentin.     10/14/2022 URI symptoms (nasal congestion, rhinorrhea, mild SOB) Of note, she had run out of her inhaler medication at symptoms onset and is experiencing cold like symptoms.     09/15/2023: She is currently feeling very well with no bacterial infections since last visit. Denies any respiratory issues or B symptoms.      Plan:  -  Continue Flonase 2 sprays each nostril in the morning and evening.   - Continue Atrovent 2 sprays 2-4 times daily as needed for PND.  - Labs ordered today: UA, CBC diff, Ig G/M/A/E. Depending on results, will consider referral to ENT to evaluate for presence of nasal polyps and Rheumatology to evaluate for possible EGPA.    - Continue to monitor for the following signs/symptoms that would be concerning for EGPA that she does not have currently:               - Vasculitis (order UA to see if any renal vasculitis signs such as dysmorphic RBC or casts)               - Arthralgia               - Myalgia               - Neuropathy (particularly in a stocking/glove distribution)               - Rash (especially on lower extremities)               - Cardiac symptoms               - Worsening respiratory function  - If cough is ever productive of frank blood (ie, more than just minor blood-streaking of mucus), go to the nearest ER or call 911 immediately.     #Moderate persistent asthma  05/05/22 - PFT with decreased FEV1/FVC of 66%, FEF 25-75% reduced value of 43%, and 21% decrease in FEV1 upon exposure to 0.5 mg/ml methacholine.  +reactive airway disease  05/06/22 - SPT positive only for sensitization to [facility], which would not explain persistent symptoms since last fall.   Patient reports good control currently.     Plan:  - May continue Arnuity Ellipta 1 puff daily.   - Discuss with Pulmonology for Prednisone script for PRN use during URI   - continue to f/u with Pulmonology for asthma management    Continue albuterol 2 puffs 15-30 minutes prior to scheduled exercise, if needed to prevent exercise-induced symptoms.  Continue albuterol 2 puffs every 4 hours as-needed for wheezing, chest tightness, shortness of breath, or cough.   Contact us the 1st time albuterol doesn't last a full 4 hours or doesn't completely relieve symptoms.   Contact us if requiring albuterol > 2 times per week for rescue of any day time asthma symptoms.   Contact us if requiring albuterol > 2 times per month for rescue of any night-time awakenings due to asthma symptoms.     Thank you for allowing [place] to participate in this patient's care.      This plan was discussed with attending, Dr. Mikeal Hawthorne, MD  Allergy Immunology Fellow  Division of Allergy, Immunology, and Rheumatology  Department of Internal Medicine and Department of Pediatrics  [facility]    Staff name:  Chapman Moss Cathleen Corti, MD Date: 09/14/2023

## 2023-09-16 DIAGNOSIS — J329 Chronic sinusitis, unspecified: Secondary | ICD-10-CM

## 2023-09-16 DIAGNOSIS — D721 Eosinophilia, unspecified: Secondary | ICD-10-CM

## 2023-09-16 DIAGNOSIS — J4540 Moderate persistent asthma, uncomplicated: Secondary | ICD-10-CM

## 2023-09-16 DIAGNOSIS — D801 Nonfamilial hypogammaglobulinemia: Secondary | ICD-10-CM

## 2023-09-16 DIAGNOSIS — J324 Chronic pansinusitis: Secondary | ICD-10-CM

## 2023-09-18 NOTE — Progress Notes
Date of Service: 09/20/2023    Subjective:             Heather Gilbert is a very pleasant 62 y.o. female with hyper IgE, hypogammaglobulinemia, sinusitis, asthma, chronic cough, shortness of breath presenting for further management of her pulmonary issues.    History of Present Illness  Heather Gilbert initially presented in July 2023 for significant shortness of breath and cough. As part of her work-up she has undergone a CBC with differential,  CMP, Aspergillus IgE, immunoglobulins, MPO/PR-3, PFT's, methacholine challenge, and chest imaging.  These studies showed peripheral eosinophilia, hyper IgE, low IgG, normal creatinine, normal LFTs, normal Aspergillus IgE, and normal MPO/PR-3.  Skin prick test showed sensitivity to [place] only.  Her PFT's showed a mild obstruction, and methacholine challenge was positive for hyperreactive airway.  Her CT chest demonstrated small nodules, bronchiectasis, tree-in-bud opacities, and areas of endobronchial mucous plugging.  CT maxillofacial sinus demonstrated pansinusitis with secretions.  Since then her symptoms have fully improved while being managed on Arnuity Ellipta and Flonase.    Currently, she has minimal pulmonary symptoms at this time.  Over the summer, she actually stopped taking Arnuity for about a week.  After that she did develop some shortness of breath and restarted.  She denies any exacerbations, urgent care visits, or corticosteroid use.  Interestingly, her sister has similar issues with high eosinophil counts. She is maintained on Arnuity Ellipta, as needed albuterol, and Flonase.                 Objective:         albuterol 0.083% (PROVENTIL) 2.5 mg /3 mL (0.083 %) nebulizer solution Inhale 3 mL solution by nebulizer as directed every 6 hours as needed for Wheezing or Shortness of Breath. Indications: asthma attack    albuterol sulfate (PROAIR HFA) 90 mcg/actuation HFA aerosol inhaler Inhale two puffs by mouth into the lungs every 6 hours as needed for Wheezing or Shortness of Breath.    citalopram hydrobromide (CELEXA PO) Take  by mouth.    fluticasone furoate (ARNUITY ELLIPTA) 200 mcg/actuation disk inhaler Inhale one puff by mouth into the lungs daily.    fluticasone propionate (FLONASE) 50 mcg/actuation nasal spray, suspension Apply two sprays to each nostril as directed daily.    ipratropium bromide (ATROVENT) 42 mcg (0.06 %) nasal spray APPLY TWO SPRAYS TO EACH NOSTRIL AS DIRECTED FOUR TIMES DAILY     Vitals:    09/20/23 1432   BP: (!) 161/95   BP Source: Arm, Left Upper   Pulse: 71   Resp: 16   SpO2: 99%   PainSc: Zero   Weight: 66.7 kg (147 lb)   Height: 158.8 cm (5' 2.5)     Body mass index is 26.46 kg/m?Marland Kitchen     Physical Exam  Constitutional:       General: She is not in acute distress.     Appearance: She is not ill-appearing or toxic-appearing.   HENT:      Head: Normocephalic and atraumatic.      Nose: Nose normal.      Mouth/Throat:      Mouth: Mucous membranes are moist.      Pharynx: Oropharynx is clear.   Eyes:      Conjunctiva/sclera: Conjunctivae normal.   Cardiovascular:      Rate and Rhythm: Normal rate and regular rhythm.      Heart sounds: No murmur heard.     No friction rub. No gallop.   Pulmonary:  Effort: Pulmonary effort is normal. No respiratory distress.      Breath sounds: Normal breath sounds. No wheezing or rales.   Abdominal:      General: Abdomen is flat.      Palpations: Abdomen is soft.      Tenderness: There is no abdominal tenderness.   Neurological:      General: No focal deficit present.      Mental Status: She is alert and oriented to person, place, and time. Mental status is at baseline.              Latest Reference Range & Units 04/08/22 16:54 06/29/22 14:44 09/15/23 14:28   Eosinophils 0 - 5 % 14 (H) 9 (H) 11 (H)   Absolute Eosinophil Count 0 - 0.45 K/UL 0.84 (H) 0.50 (H) 0.64 (H)   (H): Data is abnormally high     Latest Reference Range & Units 04/08/22 16:54 06/29/22 14:44 09/15/23 14:28   Total IgE  2,521 (H) IgE <101 IU/mL  >2500 (H) >2500 (H)   (H): Data is abnormally high     04/08/22 16:54   Aspergillus Fumigatus IgE <0.10, Reference range: <0.35 Unit: [place]/L   Aspergillus Fumigatus Class IgE 0   Aspergillus Fumigatus IgG 2.5        Latest Reference Range & Units 04/08/22 16:54   Myeloperoxidase AB <1.0 AI <0.2   Serine Protease3 AB <1.0 AI <0.2       PFT personally reviewed and interpreted:   05-05-2022: Mild OVD, normal diffusing capacity    05-05-2022, methacholine challenge: Moderate to severe hyperactive airway response    Radiology imaging personally reviewed and interpreted:  03-30-2022, CT chest:  1.  Aberrant right subclavian artery  2.  Nonspecific borderline mediastinal and hilar lymphadenopathy  3.  Bronchiectasis with areas of endobronchial mucus and debris  4.  Tiny pulmonary nodules  5.  Few areas of subsegmental atelectasis    01-08-2023, chest x-ray:  1.  No acute pulmonary abnormalities    09-20-2023, CT chest:  Aberrant right subclavian artery  2.   Stable versus improved borderline mediastinal and hilar lymphadenopathy  3.   No obvious new pulmonary nodules, awaiting official CT report    Outside records reviewed:  03-30-2022, CT chest:         Assessment & Plan   Heather Gilbert is a very pleasant 62 y.o. female presenting for further management of her pulmonary issues.    # Shortnes of breath, improved  # Bronchiectasis  # Moderate persistent asthma, well-controlled  # Hyper IgE  # Peripheal eosinophilia  # Pansinusitis    Overall, she is doing remarkably well.  She is maintained Arnuity Ellipta with minimal, no additional pulmonary symptoms.  Her most recent blood work continues to show peripheral eosinophilia and hyper IgE.  Her spirometry today shows resolved obstruction and improved FEV1 and FVC.  For now, we will continue her Arnuity Ellipta and as needed albuterol.  We discussed she would be prone to having more severe exacerbations when exposed to different infections or inflammatory processes, and to call if these occurs.    - Continue Arnuity Ellipta 200 mcg, 1 puff daily  - Continue as needed albuterol    # Pulmonary nodules  # Abnormal imaging of the lungs  No obvious new pulmonary nodules on CT imaging today; awaiting official interpretation.      Return visit:  in 1 year(s).    Gasper Lloyd, MD  Assistant Professor of Medicine  Pulmonary and Critical Care Physician  09/20/2023

## 2023-09-20 ENCOUNTER — Ambulatory Visit: Admit: 2023-09-20 | Discharge: 2023-09-20 | Payer: Private Health Insurance - Indemnity

## 2023-09-20 ENCOUNTER — Encounter: Admit: 2023-09-20 | Discharge: 2023-09-20 | Payer: Private Health Insurance - Indemnity

## 2023-09-20 DIAGNOSIS — R918 Other nonspecific abnormal finding of lung field: Secondary | ICD-10-CM

## 2023-09-20 DIAGNOSIS — R0602 Shortness of breath: Secondary | ICD-10-CM

## 2023-09-20 DIAGNOSIS — J42 Unspecified chronic bronchitis: Secondary | ICD-10-CM

## 2023-09-20 DIAGNOSIS — D721 Eosinophilia, unspecified type: Secondary | ICD-10-CM

## 2023-09-20 DIAGNOSIS — J479 Bronchiectasis, uncomplicated: Secondary | ICD-10-CM

## 2023-09-20 DIAGNOSIS — J329 Chronic sinusitis, unspecified: Secondary | ICD-10-CM

## 2023-09-20 DIAGNOSIS — F419 Anxiety disorder, unspecified: Secondary | ICD-10-CM

## 2023-09-20 DIAGNOSIS — R911 Solitary pulmonary nodule: Secondary | ICD-10-CM

## 2023-09-20 DIAGNOSIS — R053 Chronic cough: Secondary | ICD-10-CM

## 2023-09-20 DIAGNOSIS — C801 Malignant (primary) neoplasm, unspecified: Secondary | ICD-10-CM

## 2023-09-20 DIAGNOSIS — J4540 Moderate persistent asthma, uncomplicated: Secondary | ICD-10-CM

## 2023-09-20 DIAGNOSIS — J45909 Unspecified asthma, uncomplicated: Secondary | ICD-10-CM

## 2023-09-20 DIAGNOSIS — J189 Pneumonia, unspecified organism: Secondary | ICD-10-CM

## 2023-09-20 NOTE — Patient Instructions
Clinic Visit Summary:             For questions or concerns,  please leave a voicemail for my Clinical Nurse Coordinator,  Christophe Louis at [phone] or send a My Chart message.      For URGENT issues after business hours, weekends, or holidays: Call [phone] and request for the Pulmonary Fellow to be paged.  For medication refills, please ask your pharmacy to send an electronic request.  Please allow at least 3 business days for medication refills.    For equipment issues, please contact your Durable Medical Equipment (DME) company directly.  To cancel, change, or schedule a clinic appointment: Call Pulmonary Scheduling at [phone].

## 2023-09-21 ENCOUNTER — Encounter: Admit: 2023-09-21 | Discharge: 2023-09-21 | Payer: Private Health Insurance - Indemnity

## 2023-11-04 ENCOUNTER — Encounter: Admit: 2023-11-04 | Discharge: 2023-11-04 | Payer: Private Health Insurance - Indemnity

## 2023-11-04 MED ORDER — IPRATROPIUM BROMIDE 42 MCG (0.06 %) NA SPRY
0 refills | 30.00000 days | Status: AC
Start: 2023-11-04 — End: ?

## 2023-11-04 NOTE — Telephone Encounter
Pharmacy requesting a refill of ipratropium bromide (ATROVENT) 42 mcg (0.06 %) nasal spray . Patient last seen 09/15/2023.  Unable to refill per Allergy/Immunology standing orders protocol as no f/u scheduled.?      Routing to Dr.Leung for approval if warranted    Routing to Vic'kenya to schedule pt for follow up 1 year from LOV

## 2023-11-05 ENCOUNTER — Encounter: Admit: 2023-11-05 | Discharge: 2023-11-05 | Payer: Private Health Insurance - Indemnity

## 2023-11-05 DIAGNOSIS — R768 Other specified abnormal immunological findings in serum: Secondary | ICD-10-CM

## 2023-11-05 NOTE — Telephone Encounter
Labs faxed to [facility] lab [phone]

## 2023-11-12 IMAGING — CR [ID]
2 series · 2 of 2 positions shown · non-contrast
Comparison: none

[chest pa]
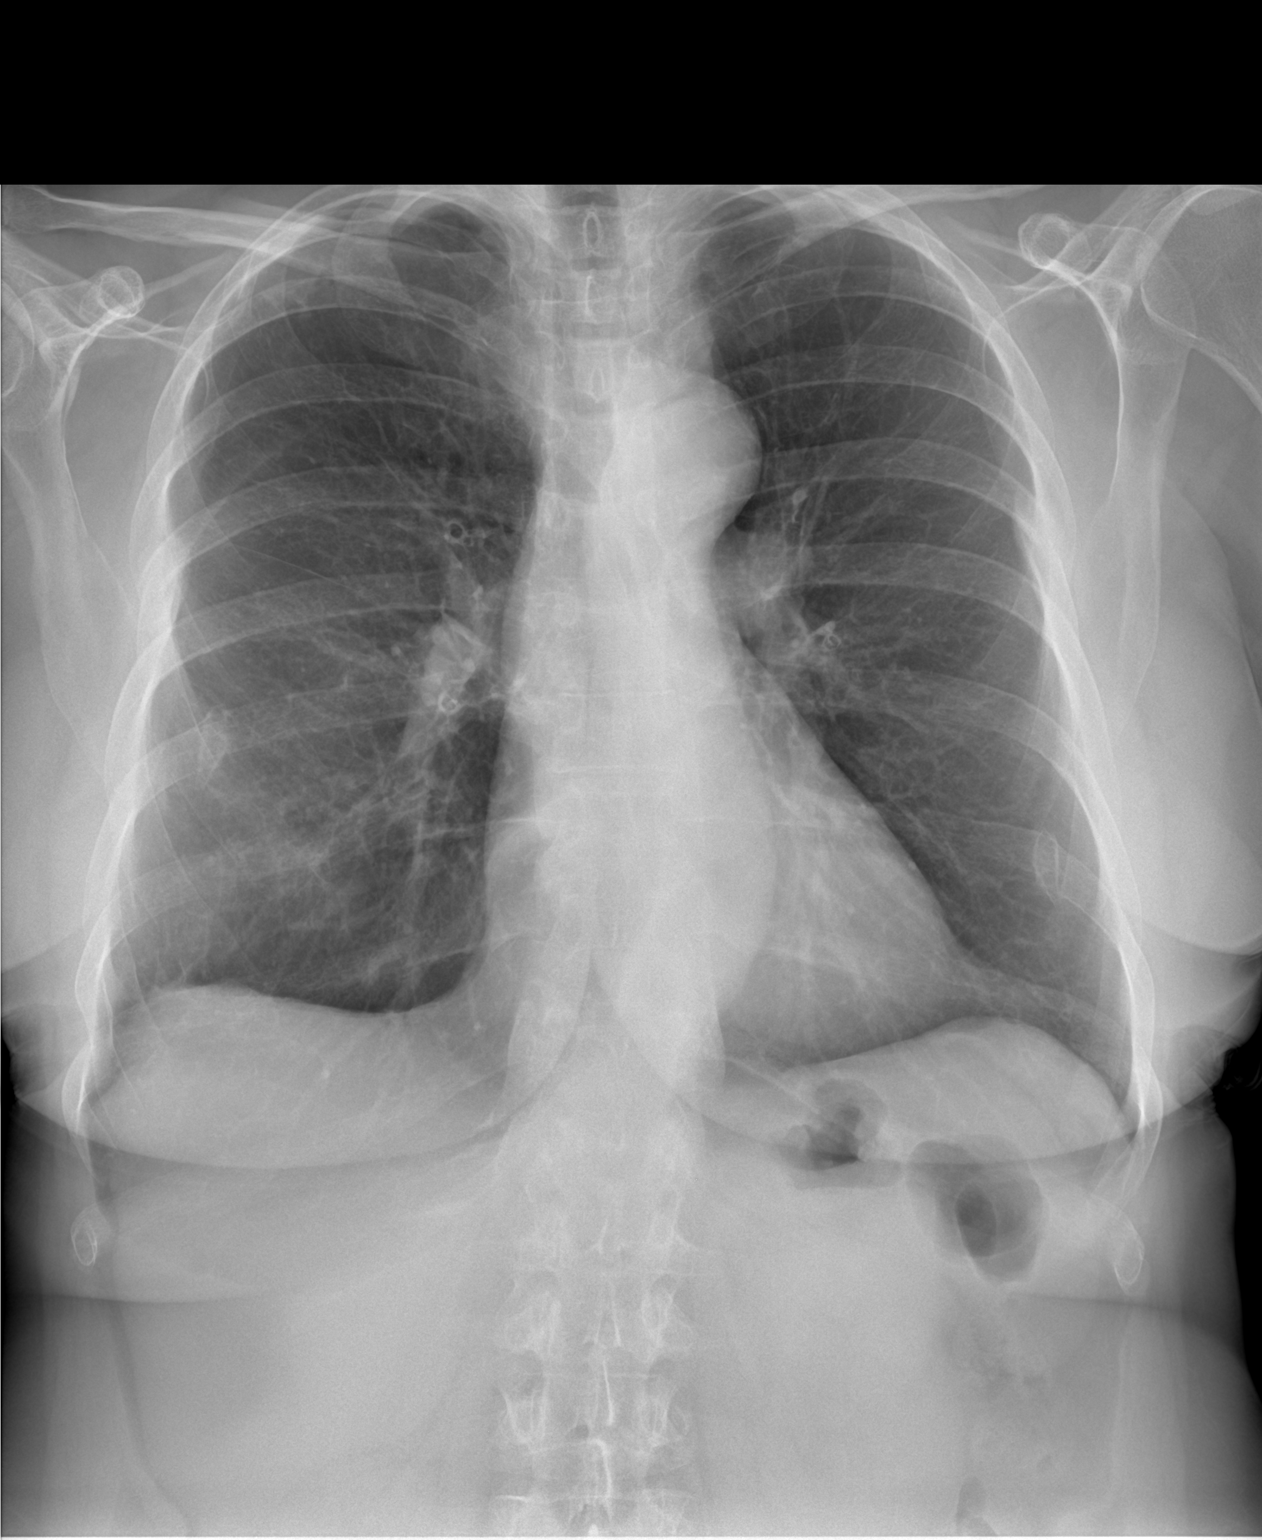

[chest lat]
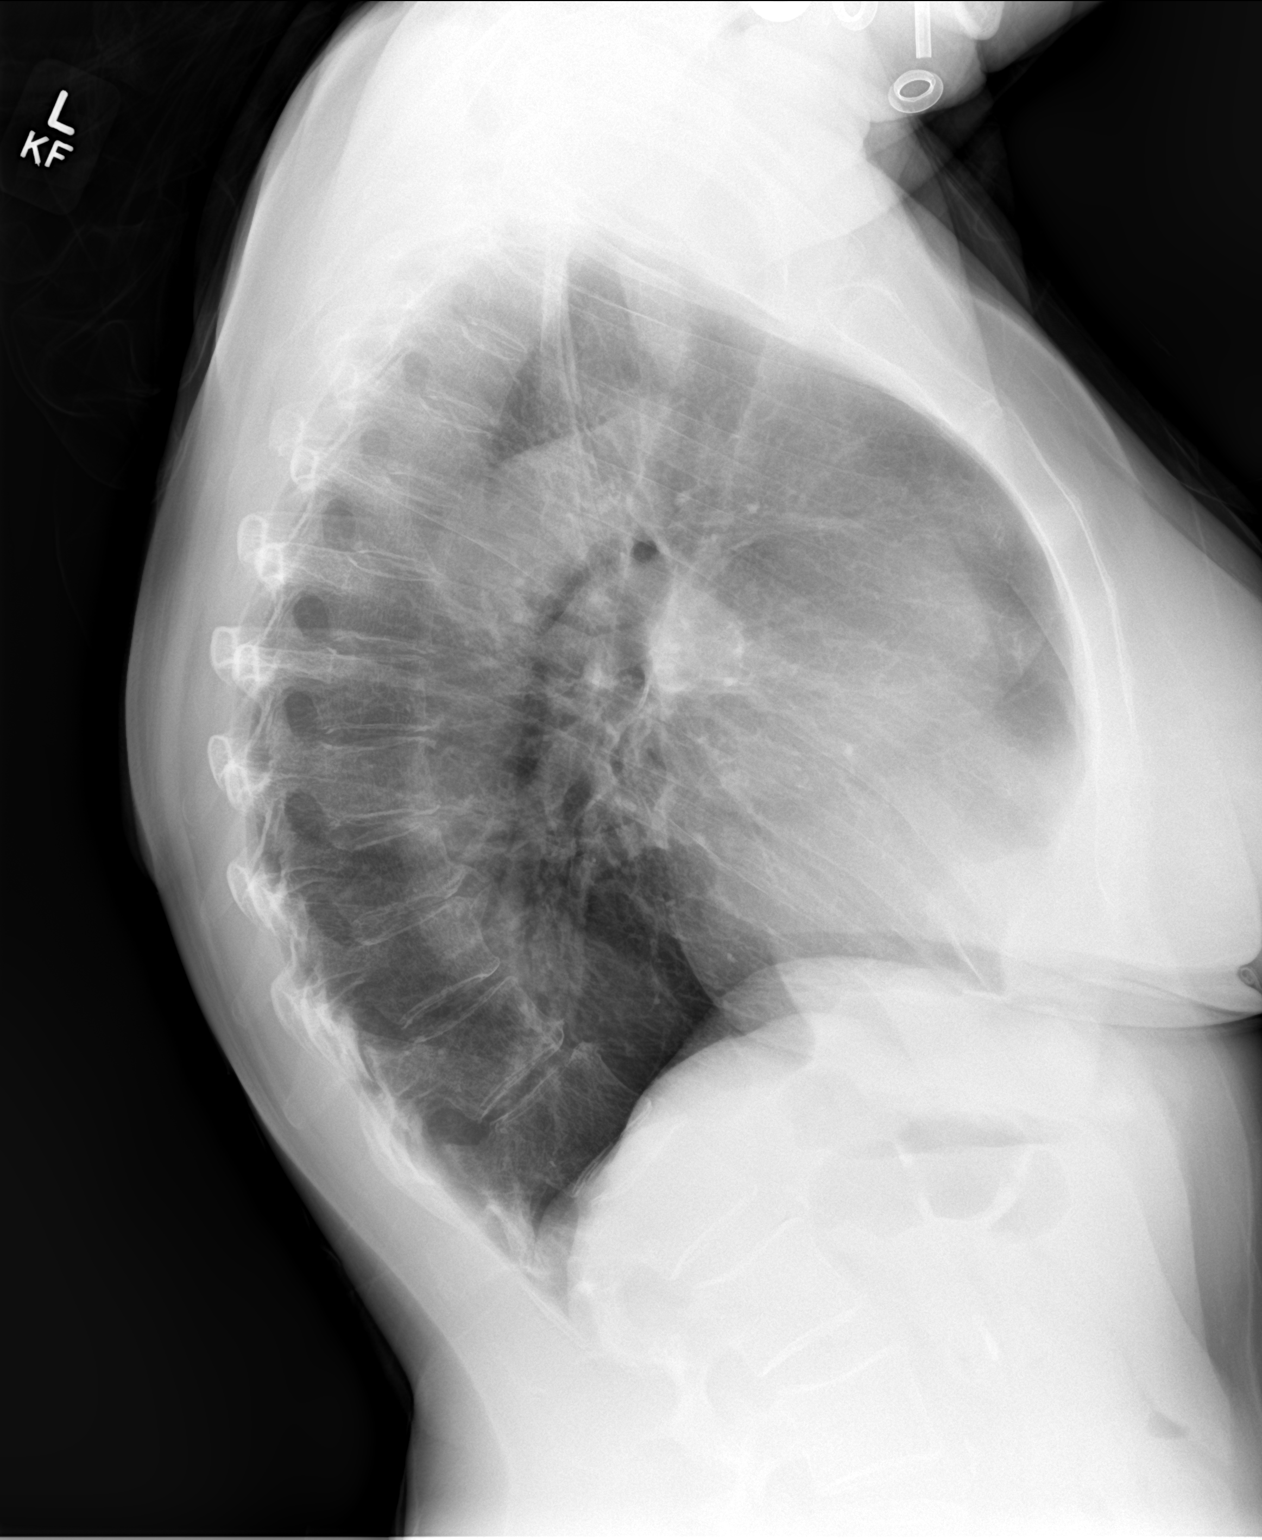

[2 of 2 positions shown; findings below may reference images not displayed]

DIAGNOSTIC STUDIES

EXAM

XR chest 2V

INDICATION

cough, hx pneumonia
lower chest pain, cough, hx of pneumonia, denies fever

TECHNIQUE

PA and lateral chest radiographs obtained.

COMPARISONS

Chest radiograph, October 24, 2021.

FINDINGS

Clear lungs. No pneumothorax or pleural effusion. Normal cardiomediastinal silhouette. Unremarkable
upper abdomen. Bones are demineralized. No acute fracture or malalignment. Disc degeneration
throughout the spine with chronic wedging of mid thoracic vertebral bodies.

IMPRESSION

No acute cardiopulmonary process.

Tech Notes:

lower chest pain, cough, hx of pneumonia, denies fever

## 2023-11-15 ENCOUNTER — Encounter: Admit: 2023-11-15 | Discharge: 2023-11-15 | Payer: Private Health Insurance - Indemnity

## 2023-11-16 ENCOUNTER — Encounter: Admit: 2023-11-16 | Discharge: 2023-11-16 | Payer: Private Health Insurance - Indemnity

## 2023-11-16 DIAGNOSIS — J471 Bronchiectasis with (acute) exacerbation: Secondary | ICD-10-CM

## 2023-11-16 DIAGNOSIS — R093 Abnormal sputum: Secondary | ICD-10-CM

## 2023-11-16 DIAGNOSIS — J4540 Moderate persistent asthma, uncomplicated: Secondary | ICD-10-CM

## 2023-11-16 MED ORDER — PREDNISONE 20 MG PO TAB
40 mg | ORAL_TABLET | Freq: Every day | ORAL | 0 refills | Status: AC
Start: 2023-11-16 — End: ?

## 2024-01-03 ENCOUNTER — Encounter: Admit: 2024-01-03 | Discharge: 2024-01-03 | Payer: Private Health Insurance - Indemnity

## 2024-01-10 ENCOUNTER — Encounter: Admit: 2024-01-10 | Discharge: 2024-01-10 | Payer: Private Health Insurance - Indemnity

## 2024-02-07 ENCOUNTER — Encounter: Admit: 2024-02-07 | Discharge: 2024-02-07 | Payer: Private Health Insurance - Indemnity

## 2024-02-07 DIAGNOSIS — J988 Other specified respiratory disorders: Secondary | ICD-10-CM

## 2024-02-07 DIAGNOSIS — D721 Eosinophilia, unspecified type: Secondary | ICD-10-CM

## 2024-02-07 DIAGNOSIS — J471 Bronchiectasis with (acute) exacerbation: Secondary | ICD-10-CM

## 2024-02-07 DIAGNOSIS — R768 Other specified abnormal immunological findings in serum: Secondary | ICD-10-CM

## 2024-02-07 DIAGNOSIS — R0989 Other specified symptoms and signs involving the circulatory and respiratory systems: Secondary | ICD-10-CM

## 2024-02-07 MED ORDER — LEVOFLOXACIN 500 MG PO TAB
500 mg | ORAL_TABLET | Freq: Every day | ORAL | 0 refills | 7.00000 days | Status: AC
Start: 2024-02-07 — End: ?

## 2024-02-11 ENCOUNTER — Encounter: Admit: 2024-02-11 | Discharge: 2024-02-11 | Payer: Private Health Insurance - Indemnity

## 2024-02-11 DIAGNOSIS — J310 Chronic rhinitis: Secondary | ICD-10-CM

## 2024-02-11 MED ORDER — FLUTICASONE PROPIONATE 50 MCG/ACTUATION NA SPSN
2 | Freq: Every day | NASAL | 1 refills | 60.00000 days | Status: AC
Start: 2024-02-11 — End: ?

## 2024-02-11 NOTE — Telephone Encounter
 Refill request received for Flonase nasal spray.  Patient last seen 09/20/23 with plan to FU in 1 year- not yet scheduled.  3  month(s) and 1 refill(s) e-scribed per E-transcribe.

## 2024-02-23 ENCOUNTER — Encounter: Admit: 2024-02-23 | Discharge: 2024-02-23 | Payer: Private Health Insurance - Indemnity

## 2024-03-07 ENCOUNTER — Encounter: Admit: 2024-03-07 | Discharge: 2024-03-07 | Payer: Private Health Insurance - Indemnity

## 2024-10-03 ENCOUNTER — Encounter: Admit: 2024-10-03 | Discharge: 2024-10-03 | Payer: PRIVATE HEALTH INSURANCE

## 2024-10-04 ENCOUNTER — Ambulatory Visit: Admit: 2024-10-04 | Discharge: 2024-10-05 | Payer: PRIVATE HEALTH INSURANCE

## 2024-10-04 ENCOUNTER — Encounter: Admit: 2024-10-04 | Discharge: 2024-10-04 | Payer: PRIVATE HEALTH INSURANCE

## 2024-10-17 NOTE — Progress Notes [1]
 Date of Service: 10/23/2024    Subjective:             Heather Gilbert is a very pleasant 63 y.o. female with hyper IgE, hypogammaglobulinemia, sinusitis, asthma, chronic cough, shortness of breath presenting for further management of her pulmonary issues.    History of Present Illness  Heather Gilbert initially presented in July 2023 for significant shortness of breath and cough. As part of her work-up she has undergone a CBC with differential,  CMP, Aspergillus IgE, immunoglobulins, MPO/PR-3, PFT's, methacholine challenge, and chest imaging.  These studies showed peripheral eosinophilia, hyper-IgE, low IgG, normal creatinine, normal LFTs, normal Aspergillus IgE, and normal MPO/PR-3.  Skin prick test showed sensitivity to [place] only.  Her PFT's showed a mild obstruction, and methacholine challenge was positive for hyperreactive airway.  Her CT chest demonstrated small nodules, bronchiectasis, tree-in-bud opacities, and areas of endobronchial mucous plugging.  CT maxillofacial sinus demonstrated pansinusitis with secretions.  Since then her symptoms have fully improved while being managed on Arnuity Ellipta  and Flonase .    Currently, she is doing very well.  She denies any exacerbations, urgent care visits, or hospitalizations related to her breathing.  She continues to use her Arnuity Ellipta , nasal ipratropium, and Flonase .  She has not needed her albuterol  inhaler last several months.  She has a distant history of tobacco use.               Objective:         albuterol  0.083% (PROVENTIL ) 2.5 mg /3 mL (0.083 %) nebulizer solution Inhale 3 mL solution by nebulizer as directed every 6 hours as needed for Wheezing or Shortness of Breath. Indications: asthma attack    albuterol  sulfate (PROAIR  HFA) 90 mcg/actuation HFA aerosol inhaler Inhale two puffs by mouth into the lungs every 6 hours as needed for Wheezing or Shortness of Breath.    citalopram hydrobromide (CELEXA PO) Take  by mouth.    fluticasone  furoate (ARNUITY ELLIPTA ) 200 mcg/actuation disk inhaler Inhale one puff by mouth into the lungs daily.    fluticasone  propionate (FLONASE ) 50 mcg/actuation nasal spray, suspension Apply two sprays to each nostril as directed daily.    ipratropium bromide  (ATROVENT ) 42 mcg (0.06 %) nasal spray INSTILL 2 SPRAYS IN EACH NOSTRIL FOUR TIMES DAILY AS DIRECTED    levoFLOXacin  (LEVAQUIN ) 500 mg tablet Take one tablet by mouth daily.     Vitals:    10/23/24 1352   BP: 129/90   BP Source: Arm, Right Upper   Pulse: 95   Temp: 36.5 ?C (97.7 ?F)   Resp: 16   SpO2: 97%   TempSrc: Oral   Weight: 61.7 kg (136 lb)   Height: 158.8 cm (5' 2.5)       Body mass index is 24.48 kg/m?SABRA     Physical Exam  Constitutional:       General: She is not in acute distress.     Appearance: She is not ill-appearing or toxic-appearing.   HENT:      Head: Normocephalic and atraumatic.      Nose: Nose normal.      Mouth/Throat:      Mouth: Mucous membranes are moist.      Pharynx: Oropharynx is clear.   Eyes:      Conjunctiva/sclera: Conjunctivae normal.   Cardiovascular:      Rate and Rhythm: Normal rate and regular rhythm.      Heart sounds: No murmur heard.     No friction rub. No  gallop.   Pulmonary:      Effort: Pulmonary effort is normal. No respiratory distress.      Breath sounds: Normal breath sounds. No wheezing or rales.   Abdominal:      General: Abdomen is flat.      Palpations: Abdomen is soft.      Tenderness: There is no abdominal tenderness.   Neurological:      General: No focal deficit present.      Mental Status: She is alert and oriented to person, place, and time. Mental status is at baseline.              Latest Reference Range & Units 04/08/22 16:54 06/29/22 14:44 09/15/23 14:28   Eosinophils 0 - 5 % 14 (H) 9 (H) 11 (H)   Absolute Eosinophil Count 0 - 0.45 K/UL 0.84 (H) 0.50 (H) 0.64 (H)   (H): Data is abnormally high     Latest Reference Range & Units 04/08/22 16:54 06/29/22 14:44 09/15/23 14:28   Total IgE  2,521 (H)     IgE <101 IU/mL  >2500 (H) >2500 (H)   (H): Data is abnormally high     04/08/22 16:54   Aspergillus Fumigatus IgE <0.10, Reference range: <0.35 Unit: [place]/L   Aspergillus Fumigatus Class IgE 0   Aspergillus Fumigatus IgG 2.5        Latest Reference Range & Units 04/08/22 16:54   Myeloperoxidase AB <1.0 AI <0.2   Serine Protease3 AB <1.0 AI <0.2       PFT personally reviewed:   05-05-2022: Mild OVD, normal diffusing capacity    05-05-2022, methacholine challenge: Moderate to severe hyperactive airway response    09-20-2023, spirometry: Normal spirometric measurements    10-04-2024, spirometry: Normal spirometric measurements    FEV1 FEV1-Pre FEV1-%Pred-Pre   Latest Ref Rng & Units L %   05/05/2022   3:14 PM 1.86  80    05/05/2022   4:13 PM 1.93  83    09/20/2023   2:48 PM 2.23  103    10/04/2024   1:51 PM 2.18  P 100  P      P Preliminary result         Radiology imaging personally reviewed:  03-30-2022, CT chest:  1.  Aberrant right subclavian artery  2.  Nonspecific borderline mediastinal and hilar lymphadenopathy  3.  Bronchiectasis with areas of endobronchial mucus and debris  4.  Tiny pulmonary nodules  5.  Few areas of subsegmental atelectasis    01-08-2023, chest x-ray:  1.  No acute pulmonary abnormalities    09-20-2023, CT chest:  Aberrant right subclavian artery  2.   Stable versus improved borderline mediastinal and hilar lymphadenopathy  3.   No obvious new pulmonary nodules, awaiting official CT report    Outside records reviewed:  03-30-2022, CT chest:         Assessment & Plan   Heather Gilbert is a very pleasant 63 y.o. female presenting for further management of her pulmonary issues.    # Shortnes of breath, significantly improved  # Bronchiectasis  # Moderate persistent asthma, well-controlled  # Hyper IgE  # Peripheal eosinophilia  # Pansinusitis    Overall, she is doing remarkably well.  She is maintained Arnuity Ellipta  with minimal, no additional pulmonary symptoms.  Her spirometry continues to show a resolved obstruction.  For now, we will continue her Arnuity Ellipta  and as needed albuterol .    - Continue Arnuity Ellipta  200 mcg, 1  puff daily  - Continue as needed albuterol     # Pulmonary nodules  # Abnormal imaging of the lungs  Her prior chest imaging showed tree-in-bud nodules and inflammatory changes.  She has a very remote and minimal history of tobacco use.  Will continue to monitor this conservatively.    Return visit:  in 1 year(s). or sooner if needed    Rodgers Ernst, MD  Assistant Professor of Medicine  Pulmonary and Critical Care Physician  10/23/2024

## 2024-10-19 NOTE — Patient Instructions [37]
 For questions or concerns,  please leave a voicemail for my Clinical Nurse Coordinator,    Charmaine Folds at [phone] or send a My Chart message.       ? For URGENT issues after business hours, weekends, or holidays: Call [phone] and request for the Pulmonary Fellow to be paged.  ? For medication refills, please ask your pharmacy to send an electronic request.  Please allow at least 3 business days for medication refills.    ? For equipment issues, please contact your Durable Medical Equipment (DME) company directly.  ? To cancel, change, or schedule a clinic appointment: Call Pulmonary Scheduling at [phone].

## 2024-10-23 ENCOUNTER — Ambulatory Visit: Admit: 2024-10-23 | Discharge: 2024-10-24 | Payer: PRIVATE HEALTH INSURANCE

## 2024-10-23 ENCOUNTER — Ambulatory Visit: Admit: 2024-10-23 | Discharge: 2024-10-23 | Payer: PRIVATE HEALTH INSURANCE

## 2024-10-23 ENCOUNTER — Encounter: Admit: 2024-10-23 | Discharge: 2024-10-23 | Payer: PRIVATE HEALTH INSURANCE

## 2024-10-23 VITALS — BP 129/90 | HR 95 | Temp 97.70000°F | Resp 16 | Ht 62.5 in | Wt 136.0 lb

## 2024-10-23 DIAGNOSIS — J479 Bronchiectasis, uncomplicated: Secondary | ICD-10-CM

## 2024-10-23 DIAGNOSIS — R0602 Shortness of breath: Principal | ICD-10-CM

## 2024-10-23 DIAGNOSIS — R911 Solitary pulmonary nodule: Secondary | ICD-10-CM

## 2024-10-23 DIAGNOSIS — R918 Other nonspecific abnormal finding of lung field: Secondary | ICD-10-CM

## 2024-10-23 DIAGNOSIS — D721 Eosinophilia, unspecified type: Secondary | ICD-10-CM

## 2024-10-30 ENCOUNTER — Encounter: Admit: 2024-10-30 | Discharge: 2024-10-30 | Payer: PRIVATE HEALTH INSURANCE

## 2024-11-08 ENCOUNTER — Encounter: Admit: 2024-11-08 | Discharge: 2024-11-08 | Payer: PRIVATE HEALTH INSURANCE

## 2024-11-08 DIAGNOSIS — D801 Nonfamilial hypogammaglobulinemia: Principal | ICD-10-CM

## 2024-11-15 ENCOUNTER — Encounter: Admit: 2024-11-15 | Discharge: 2024-11-15 | Payer: PRIVATE HEALTH INSURANCE

## 2024-11-15 ENCOUNTER — Ambulatory Visit: Admit: 2024-11-15 | Discharge: 2024-11-16 | Payer: PRIVATE HEALTH INSURANCE

## 2024-11-15 DIAGNOSIS — J310 Chronic rhinitis: Principal | ICD-10-CM

## 2024-11-15 MED ORDER — FLUTICASONE PROPIONATE 50 MCG/ACTUATION NA SPSN
2 | Freq: Every day | NASAL | 3 refills | 60.00000 days | Status: AC
Start: 2024-11-15 — End: ?

## 2024-11-15 NOTE — Telephone Encounter [36]
 RN received refill request from [facility] for Flonase  nasal spray.  Patient last seen 10/23/24.  Plan to RTC in 1 year.   3  month(s) and 3 refill(s) e-scripted per E-transcribe.

## 2024-11-15 NOTE — Progress Notes [1]
 Confirmed correct patient with two patient identifiers and confirmed correct vaccine, PPSV23. Patient signed vaccine consent. Administered injection in RUA, patient tolerated well. No s/s of an adverse reaction while in clinic. Patient received most recent VIS sheet upon departure.

## 2024-11-21 ENCOUNTER — Encounter: Admit: 2024-11-21 | Discharge: 2024-11-21 | Payer: PRIVATE HEALTH INSURANCE

## 2024-11-21 DIAGNOSIS — T50A95A Adverse effect of other bacterial vaccines, initial encounter: Principal | ICD-10-CM

## 2024-11-21 DIAGNOSIS — R051 Acute cough: Secondary | ICD-10-CM

## 2024-11-21 MED ORDER — BENZONATATE 200 MG PO CAP
200 mg | ORAL_CAPSULE | ORAL | 0 refills | 9.00000 days | Status: AC
Start: 2024-11-21 — End: ?

## 2024-11-21 NOTE — Progress Notes [1]
 Telehealth Visit Note    Date of Service: 11/21/2024    Subjective:       Heather Gilbert is a 63 y.o. female.    History of Present Illness:  [name] is here today for evaluation and treatment of congestion, runny nose, cough, and sinus pain/pressure for the past 4 days.  She had the pneumonia vaccine the day before that.  She is having a lot of drainage, congestion, and pressure in her ears.  She has taken Ibuprofen and Mucinex with minimal relief.  She has had some chest tightness but denies any wheezing or shortness of breath with this.  She denies any aggravating factors at this time. She states that she is coughing up green mucus when she can get it up.  She uses a Flonase  and ipratropium nasal spray and has an albuterol  inhaler at home if needed.              Objective   Objective:          albuterol  0.083% (PROVENTIL ) 2.5 mg /3 mL (0.083 %) nebulizer solution Inhale 3 mL solution by nebulizer as directed every 6 hours as needed for Wheezing or Shortness of Breath. Indications: asthma attack    albuterol  sulfate (PROAIR  HFA) 90 mcg/actuation HFA aerosol inhaler Inhale two puffs by mouth into the lungs every 6 hours as needed for Wheezing or Shortness of Breath.    citalopram hydrobromide (CELEXA PO) Take  by mouth.    fluticasone  furoate (ARNUITY ELLIPTA ) 200 mcg/actuation disk inhaler Inhale one puff by mouth into the lungs daily.    fluticasone  propionate (FLONASE ) 50 mcg/actuation nasal spray, suspension Apply two sprays to each nostril as directed daily.    ipratropium bromide  (ATROVENT ) 42 mcg (0.06 %) nasal spray INSTILL 2 SPRAYS IN EACH NOSTRIL FOUR TIMES DAILY AS DIRECTED    levoFLOXacin  (LEVAQUIN ) 500 mg tablet Take one tablet by mouth daily.       Computed Telehealth Body Mass Index unavailable. One or more values for this score either were not found within the given timeframe or did not fit some other criterion.  Physical Exam  Constitutional:       Appearance: Normal appearance.   HENT: Head: Normocephalic.      Mouth/Throat:      Lips: Pink.   Eyes:      General: Lids are normal.   Pulmonary:      Effort: Pulmonary effort is normal.   Neurological:      General: No focal deficit present.      Mental Status: She is alert and oriented to person, place, and time.   Psychiatric:         Attention and Perception: Attention and perception normal.         Mood and Affect: Mood and affect normal.         Speech: Speech normal.         Behavior: Behavior normal. Behavior is cooperative.         Thought Content: Thought content normal.         Cognition and Memory: Cognition and memory normal.         Judgment: Judgment normal.             Assessment and Plan:  1. Adverse effect of pneumococcal vaccine, initial encounter        2. Acute cough            Encounter Medications   Medications  benzonatate  (TESSALON ) 200 mg capsule     Sig: Take one capsule by mouth every 8 hours.     Dispense:  30 capsule     Refill:  0     Collaborating / Supervising Provider:   RING, HOPE A N [[id number]]     Patient Instructions   We discussed that your symptoms are likely due to the recent vaccine you got but could be due to a viral illness.  You were given a prescription for Tessalon  pearls to take as needed for your cough up to 3 times a day.    You can do 2 puffs of your Albuterol  inhaler or nebulizer every 4-6 hours as needed for coughing, wheezing, chest tightness, and/or shortness of breath.       You should take the Tessalon  pearls with a small sip of water and wait 15-20 minutes to eat/drink to allow the medication to start working.  You should take Zyrtec and Flonase  daily for the next week and then as needed after that.  You can take Benadryl at bedtime as needed for your symptoms and to help you sleep if the steroids are keeping you awake at night.  You can take 600 mg of Ibuprofen every 6-8 hours as needed for fever and pain.  Please make sure to get plenty of fluids and rest along with running a humidifier in your room at night.  Follow up to an in person urgent care if your symptoms worsen, fail to improve, or you have any other urgent/emergent concerns.    Please check MyChart for your results which may take up to 72 hours.   We will send a MyChart results message but attempt to call you if you still need to sign up for MyChart.   Information to sign up to MyChart is on your After Visit Summary (AVS)  The MyChart help number is [phone], and the nurse triage number is [phone].                                15 minutes spent on this patient's encounter with counseling and coordination of care taking >50% of the visit.

## 2024-11-21 NOTE — Patient Instructions [37]
 We discussed that your symptoms are likely due to the recent vaccine you got but could be due to a viral illness.  You were given a prescription for Tessalon  pearls to take as needed for your cough up to 3 times a day.    You can do 2 puffs of your Albuterol  inhaler or nebulizer every 4-6 hours as needed for coughing, wheezing, chest tightness, and/or shortness of breath.       You should take the Tessalon  pearls with a small sip of water and wait 15-20 minutes to eat/drink to allow the medication to start working.  You should take Zyrtec and Flonase  daily for the next week and then as needed after that.  You can take Benadryl at bedtime as needed for your symptoms and to help you sleep if the steroids are keeping you awake at night.  You can take 600 mg of Ibuprofen every 6-8 hours as needed for fever and pain.  Please make sure to get plenty of fluids and rest along with running a humidifier in your room at night.  Follow up to an in person urgent care if your symptoms worsen, fail to improve, or you have any other urgent/emergent concerns.    Please check MyChart for your results which may take up to 72 hours.   We will send a MyChart results message but attempt to call you if you still need to sign up for MyChart.   Information to sign up to MyChart is on your After Visit Summary (AVS)  The MyChart help number is [phone], and the nurse triage number is [phone].

## 2024-12-22 ENCOUNTER — Encounter: Admit: 2024-12-22 | Discharge: 2024-12-22 | Payer: PRIVATE HEALTH INSURANCE
